# Patient Record
Sex: Female | Born: 1995 | Race: White | Hispanic: No | Marital: Married | Smoking: Never smoker
Health system: Southern US, Community
[De-identification: ages and names within clinical notes are randomized; demographics above are authoritative.]

## PROBLEM LIST (undated history)

## (undated) DIAGNOSIS — I10 Essential (primary) hypertension: Secondary | ICD-10-CM

## (undated) DIAGNOSIS — F419 Anxiety disorder, unspecified: Secondary | ICD-10-CM

## (undated) HISTORY — DX: Anxiety disorder, unspecified: F41.9

---

## 2016-12-09 ENCOUNTER — Ambulatory Visit (INDEPENDENT_AMBULATORY_CARE_PROVIDER_SITE_OTHER): Admitting: Obstetrics and Gynecology

## 2016-12-09 ENCOUNTER — Encounter: Payer: Self-pay | Admitting: Obstetrics and Gynecology

## 2016-12-09 VITALS — BP 104/60 | HR 78 | Ht 64.0 in | Wt 183.0 lb

## 2016-12-09 DIAGNOSIS — F419 Anxiety disorder, unspecified: Secondary | ICD-10-CM | POA: Insufficient documentation

## 2016-12-09 DIAGNOSIS — N898 Other specified noninflammatory disorders of vagina: Secondary | ICD-10-CM | POA: Diagnosis not present

## 2016-12-09 NOTE — Progress Notes (Signed)
   Chief Complaint  Patient presents with  . Vaginitis    Hard lump on outside of vagina x few days, painful    HPI:      Ms. Desiree Combs is a 21 y.o. No obstetric history on file. who LMP was Patient's last menstrual period was 11/19/2016 (exact date)., presents today for vaginal bump for the past 4 days. Area is painful, but pt denies any drainage, fevers. She thinks it has gotten a little bigger. No new soaps. Did shave before sx. No other vag sx.    Patient Active Problem List   Diagnosis Date Noted  . Anxiety     History reviewed. No pertinent family history.  Social History   Social History  . Marital status: Single    Spouse name: N/A  . Number of children: N/A  . Years of education: N/A   Occupational History  . Not on file.   Social History Main Topics  . Smoking status: Never Smoker  . Smokeless tobacco: Never Used  . Alcohol use No  . Drug use: No  . Sexual activity: Not Currently    Birth control/ protection: None   Other Topics Concern  . Not on file   Social History Narrative  . No narrative on file     Current Outpatient Prescriptions:  .  PARoxetine (PAXIL) 10 MG tablet, Take by mouth., Disp: , Rfl:   Review of Systems  Constitutional: Negative for fever.  Gastrointestinal: Negative for blood in stool, constipation, diarrhea, nausea and vomiting.  Genitourinary: Positive for genital sores and vaginal pain. Negative for dyspareunia, dysuria, flank pain, frequency, hematuria, urgency, vaginal bleeding and vaginal discharge.  Musculoskeletal: Negative for back pain.  Skin: Negative for rash.     OBJECTIVE:   Vitals:  BP 104/60 (BP Location: Left Arm, Patient Position: Sitting, Cuff Size: Normal)   Pulse 78   Ht 5\' 4"  (1.626 m)   Wt 183 lb (83 kg)   LMP 11/19/2016 (Exact Date)   BMI 31.41 kg/m   Physical Exam  Constitutional: She is oriented to person, place, and time and well-developed, well-nourished, and in no distress. Vital  signs are normal.  Genitourinary: Vulva exhibits erythema, lesion and tenderness. Vulva exhibits no rash.  Neurological: She is oriented to person, place, and time.     Assessment/Plan: Vaginal lesion - Folliculitis LT labia majora. Warm compresses/reassurance. F/u prn.     Return in about 1 month (around 01/08/2017) for annual past due.  Cesareo Vickrey B. Marishka Rentfrow, PA-C 12/09/2016 11:09 AM

## 2016-12-14 ENCOUNTER — Ambulatory Visit: Payer: Self-pay | Admitting: Obstetrics and Gynecology

## 2017-01-07 ENCOUNTER — Encounter: Payer: Self-pay | Admitting: Advanced Practice Midwife

## 2017-01-07 ENCOUNTER — Ambulatory Visit (INDEPENDENT_AMBULATORY_CARE_PROVIDER_SITE_OTHER): Admitting: Advanced Practice Midwife

## 2017-01-07 VITALS — BP 118/68 | Ht 64.0 in | Wt 182.0 lb

## 2017-01-07 DIAGNOSIS — Z01419 Encounter for gynecological examination (general) (routine) without abnormal findings: Secondary | ICD-10-CM

## 2017-01-07 DIAGNOSIS — Z124 Encounter for screening for malignant neoplasm of cervix: Secondary | ICD-10-CM | POA: Diagnosis not present

## 2017-01-07 NOTE — Progress Notes (Signed)
Patient ID: Desiree Combs, female   DOB: 05/03/1996, 21 y.o.   MRN: [id number]     Gynecology Annual Exam  PCP: Ezequiel Kayser, MD  Chief Complaint:  Chief Complaint  Patient presents with  . Annual Exam    History of Present Illness: Patient is a 21 y.o. G0P0000 presents for annual exam. The patient has no complaints today. She has fertility questions based on family history of fertility issues. Information and reassurance given. The patient is not currently sexually active and has not yet attempted to conceive. Her husband is currently deployed.   LMP: Patient's last menstrual period was 12/19/2016. Menarche:not applicable Average Interval: regular, 28 days Duration of flow: 7 days Heavy Menses: no Clots: no Intermenstrual Bleeding: no Postcoital Bleeding: no Dysmenorrhea: no  The patient is not currently sexually active. She currently uses none for contraception. She denies dyspareunia.  The patient does not perform self breast exams.  There is no notable family history of breast or ovarian cancer in her family.  The patient wears seatbelts: yes.  The patient has regular exercise: yes.    The patient denies current symptoms of depression.    Review of Systems: Review of Systems  Constitutional: Negative.   HENT: Negative.   Eyes: Negative.   Respiratory: Negative.   Cardiovascular: Negative.   Gastrointestinal: Negative.   Genitourinary: Negative.   Musculoskeletal: Negative.   Skin: Negative.   Neurological: Negative.   Endo/Heme/Allergies: Negative.   Psychiatric/Behavioral: Negative.     Past Medical History:  Past Medical History:  Diagnosis Date  . Anxiety     Past Surgical History:  History reviewed. No pertinent surgical history.  Gynecologic History:  Patient's last menstrual period was 12/19/2016. Contraception: none Last Pap: Results were: normal   Obstetric History: G0P0000  Family History:  History reviewed. No pertinent family  history.  Social History:  Social History   Social History  . Marital status: Single    Spouse name: N/A  . Number of children: N/A  . Years of education: N/A   Occupational History  . Not on file.   Social History Main Topics  . Smoking status: Never Smoker  . Smokeless tobacco: Never Used  . Alcohol use No  . Drug use: No  . Sexual activity: Not Currently    Birth control/ protection: None   Other Topics Concern  . Not on file   Social History Narrative  . No narrative on file    Allergies:  No Known Allergies  Medications: Prior to Admission medications   Medication Sig Start Date End Date Taking? Authorizing Provider  PARoxetine (PAXIL) 10 MG tablet Take by mouth. 05/20/16   [provider]    Physical Exam Vitals: Blood pressure 118/68, height 5\' 4"  (1.626 m), weight 182 lb (82.6 kg), last menstrual period 12/19/2016.  General: NAD HEENT: normocephalic, anicteric Thyroid: no enlargement, no palpable nodules Pulmonary: No increased work of breathing, CTAB Cardiovascular: RRR, distal pulses 2+ Breast: Breast symmetrical, no tenderness, no palpable nodules or masses, no skin or nipple retraction present, no nipple discharge.  No axillary or supraclavicular lymphadenopathy. Abdomen: NABS, soft, non-tender, non-distended.  Umbilicus without lesions.  No hepatomegaly, splenomegaly or masses palpable. No evidence of hernia  Genitourinary:  External: Normal external female genitalia.  Normal urethral meatus, normal  Bartholin's and Skene's glands.    Vagina: Normal vaginal mucosa, no evidence of prolapse.    Cervix: Grossly normal in appearance, no bleeding, no CMT  Uterus: Non-enlarged, mobile, normal  contour.    Adnexa: ovaries non-enlarged, no adnexal masses  Rectal: deferred  Lymphatic: no evidence of inguinal lymphadenopathy Extremities: no edema, erythema, or tenderness Neurologic: Grossly intact Psychiatric: mood appropriate, affect  full   Assessment: 21 y.o. G0P0000 Well Woman exam with PAP smear  Plan: Problem List Items Addressed This Visit    None    Visit Diagnoses    Well woman exam with routine gynecological exam    -  Primary   Relevant Orders   IGP, rfx Aptima HPV ASCU   Cervical cancer screening       Relevant Orders   IGP, rfx Aptima HPV ASCU      1) 4) Gardasil Series discussed and if applicable offered to patient - Patient has previously completed 3 shot series   2) STI screening was offered and declined   3) ASCCP guidelines and rational discussed.  Patient opts for yearly screening interval  4) Contraception - Patient prefers to take nothing for birth control at this time  5) Continue healthy lifestyle diet and exercise  6) Follow up 1 year for routine annual exam   Rod Can, CNM

## 2017-01-09 LAB — IGP, RFX APTIMA HPV ASCU: PAP Smear Comment: 0

## 2019-03-01 ENCOUNTER — Ambulatory Visit
Admission: EM | Admit: 2019-03-01 | Discharge: 2019-03-01 | Disposition: A | Discharge status: Home / Self Care | Attending: Family Medicine | Admitting: Family Medicine

## 2019-03-01 ENCOUNTER — Other Ambulatory Visit: Payer: Self-pay

## 2019-03-01 ENCOUNTER — Ambulatory Visit (INDEPENDENT_AMBULATORY_CARE_PROVIDER_SITE_OTHER)

## 2019-03-01 ENCOUNTER — Encounter: Payer: Self-pay | Admitting: Emergency Medicine

## 2019-03-01 DIAGNOSIS — S52502A Unspecified fracture of the lower end of left radius, initial encounter for closed fracture: Secondary | ICD-10-CM | POA: Diagnosis not present

## 2019-03-01 DIAGNOSIS — W01198A Fall on same level from slipping, tripping and stumbling with subsequent striking against other object, initial encounter: Secondary | ICD-10-CM | POA: Diagnosis not present

## 2019-03-01 DIAGNOSIS — M25532 Pain in left wrist: Secondary | ICD-10-CM | POA: Diagnosis not present

## 2019-03-01 IMAGING — CR DG WRIST COMPLETE 3+V*L*
4 series · 4 of 4 positions shown · non-contrast
Comparison: None.

CLINICAL DATA: Fall with pain to the left wrist

EXAM:
LEFT WRIST - COMPLETE 3+ VIEW

[wrist pa]
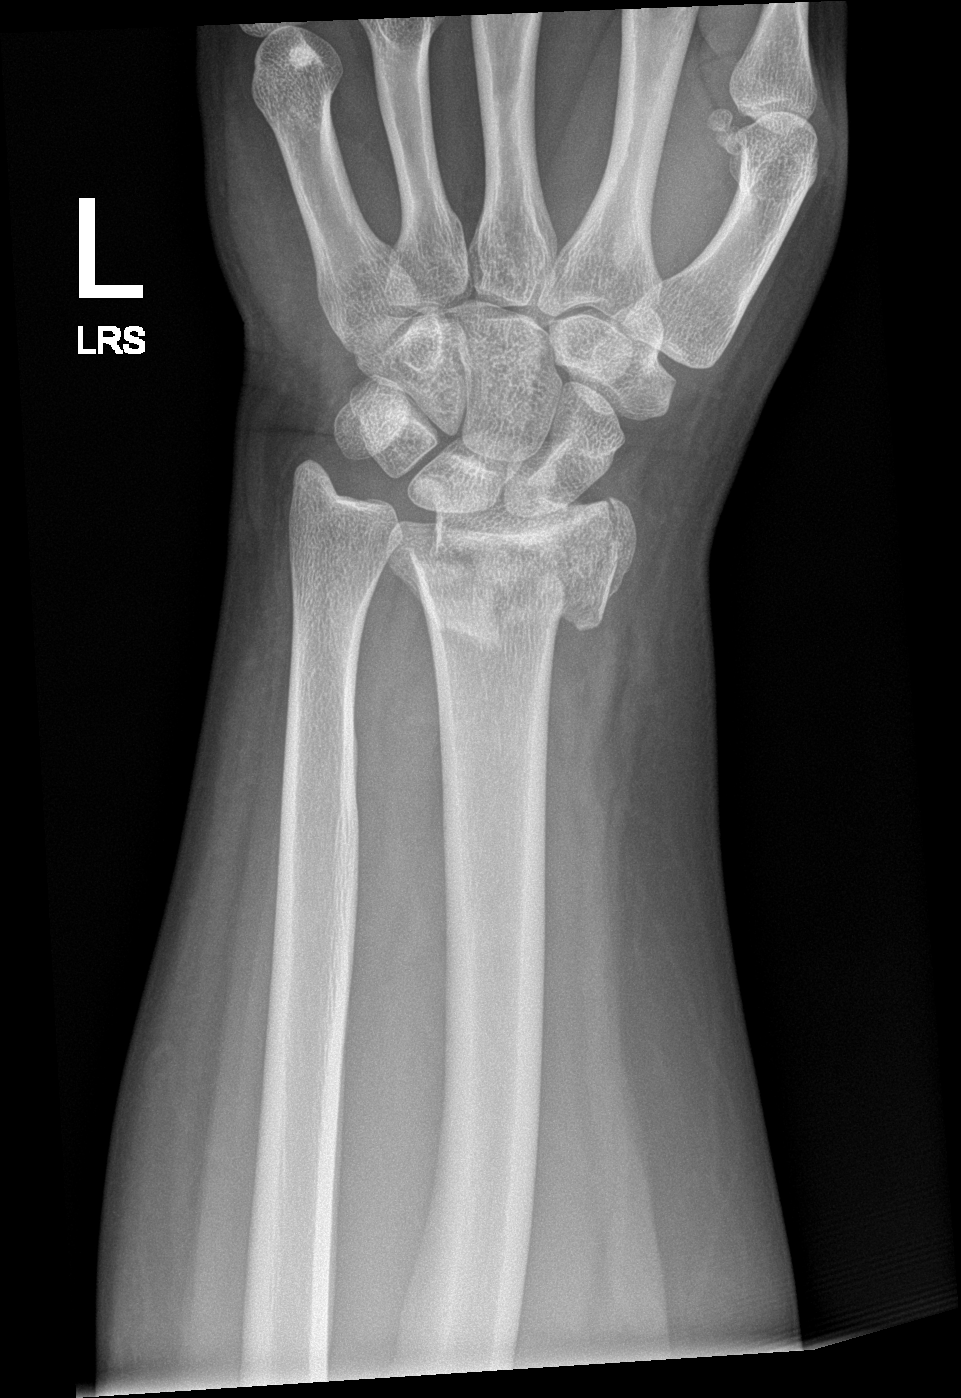

[wrist obl]
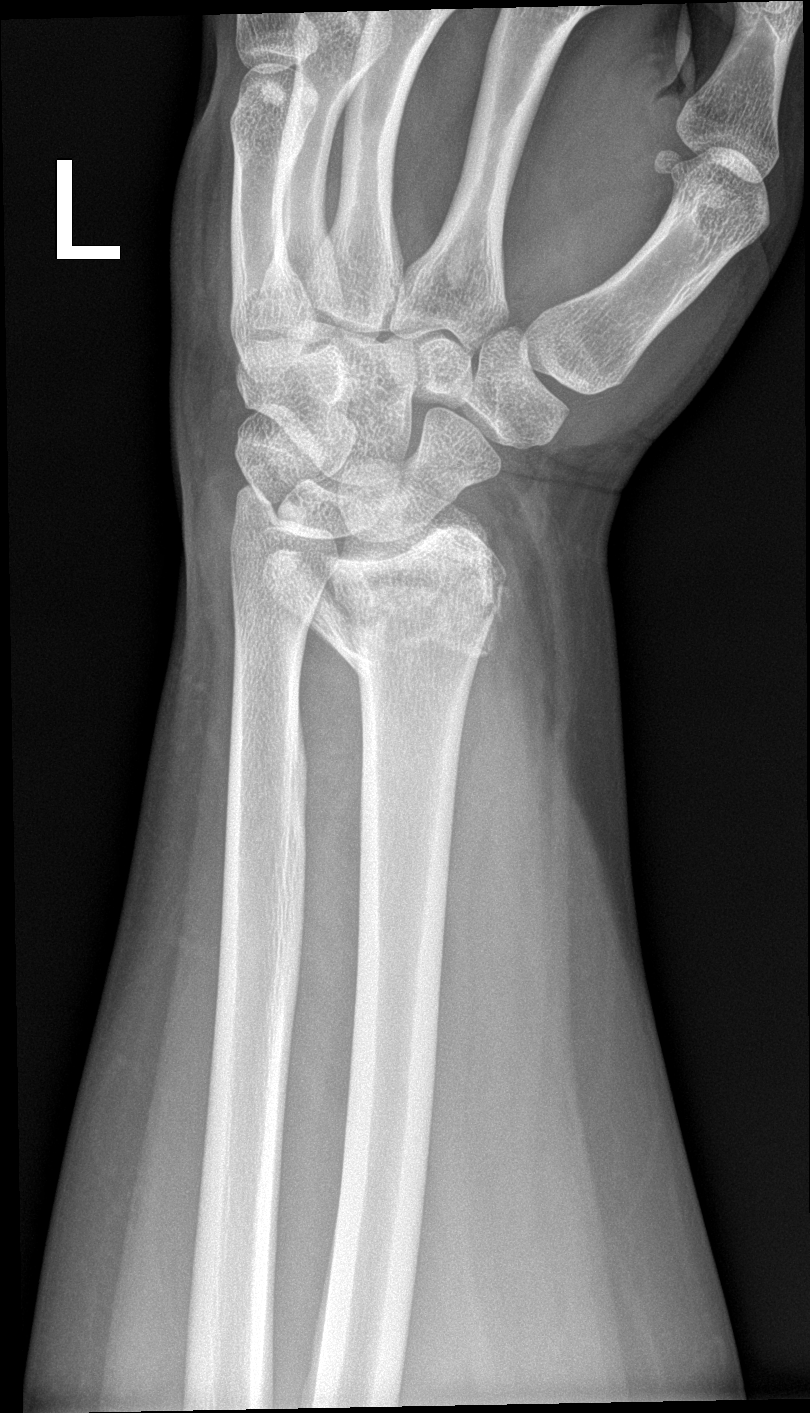

[wrist lat]
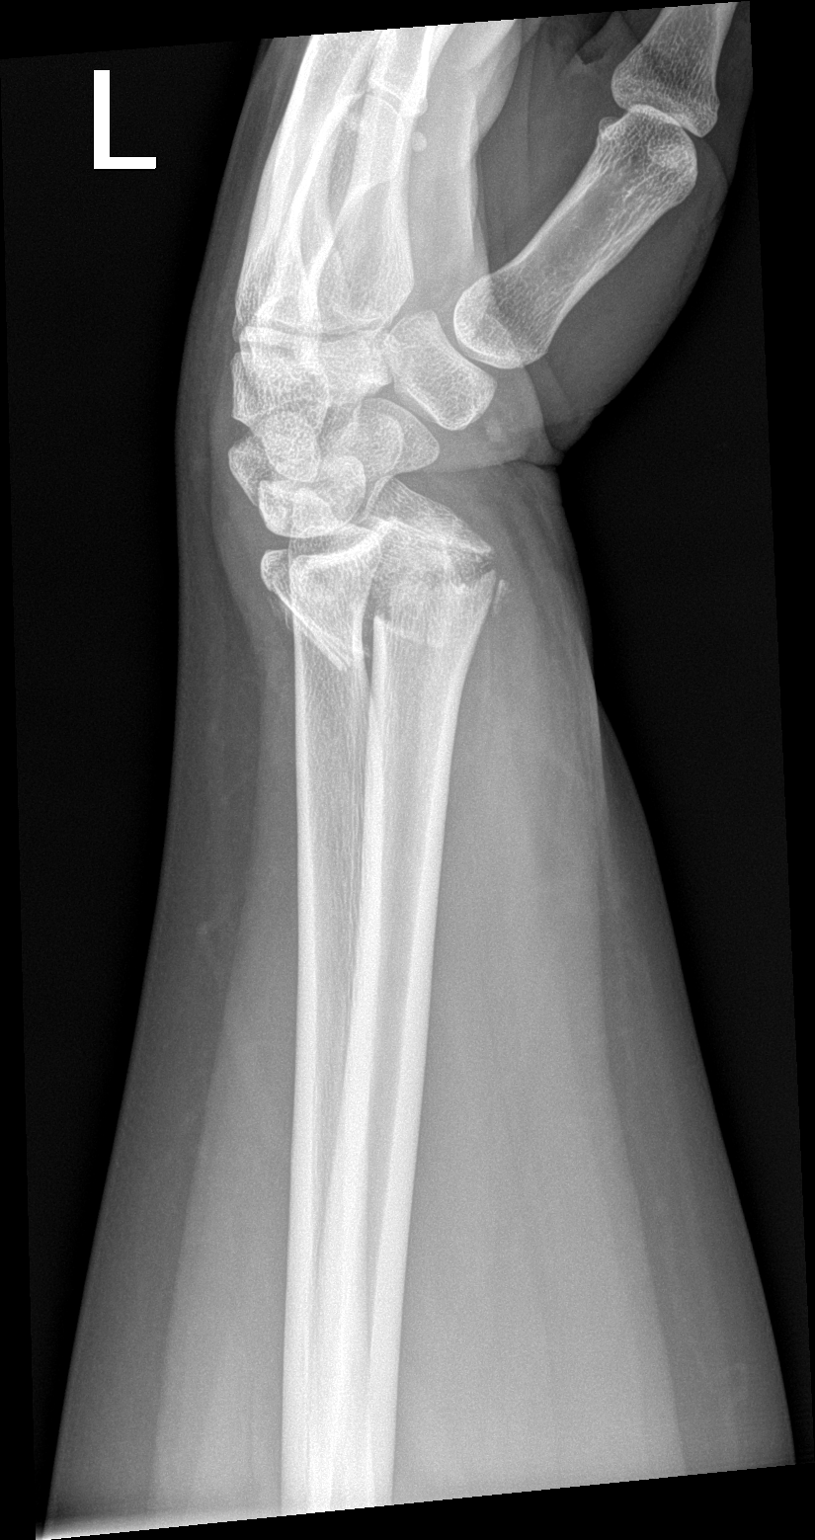

[wrist navicular]
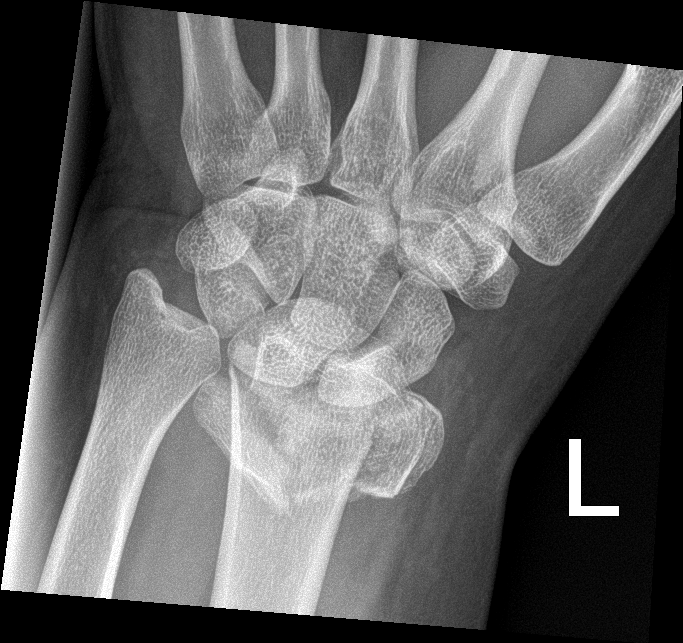

[4 of 4 positions shown; findings below may reference images not displayed]

FINDINGS: Acute markedly comminuted intra-articular distal radius fracture
with impaction and moderate dorsal angulation of distal fracture
fragment. About [DATE] bone with radial displacement of distal fracture
fragments.
IMPRESSION: Acute comminuted intra-articular distal radius fracture with
displacement and angulation

## 2019-03-01 MED ORDER — OXYCODONE-ACETAMINOPHEN 5-325 MG PO TABS: 1.0000 | ORAL_TABLET | Freq: Four times a day (QID) | ORAL | 0 refills | Status: DC | PRN

## 2019-03-01 NOTE — Discharge Instructions (Signed)
Take medication as prescribed.  Over-the-counter ibuprofen as needed.  Ice and elevate is very important.  Follow-up with orthopedist tomorrow, see above to call first thing in the morning.  Follow up with your primary care physician this week as needed. Return to Urgent care for new or worsening concerns.

## 2019-03-01 NOTE — ED Triage Notes (Signed)
Patient c/o riding on a hover board about 30 minutes and fell off injuring her left wrist.

## 2019-03-01 NOTE — ED Provider Notes (Signed)
[facility] ____________________________________________  Time seen: Approximately 5:19 PM  I have reviewed the triage vital signs and the nursing notes.   HISTORY  Chief Complaint Wrist Pain  HPI Desiree Combs is a 23 y.o. female presenting for evaluation of left wrist pain after injury that occurred just prior to arrival.  Reports within the last 30 minutes she was on a hover board, lost her balance and fell forward catching herself with her left hand.  Right-hand-dominant.  Denies any other injuries.  No head injury or loss of consciousness.  States pain and limited range of motion since.  States that she can move all of her fingers but hurts that wrist.  Denies paresthesias or pain radiation.  No recent cough, fevers, chest pain or shortness of breath.  Patient's last menstrual period was 02/22/2019.  Denies pregnancy    Past Medical History:  Diagnosis Date  . Anxiety     Patient Active Problem List   Diagnosis Date Noted  . Anxiety     History reviewed. No pertinent surgical history.   No current facility-administered medications for this encounter.   Current Outpatient Medications:  .  PARoxetine (PAXIL) 10 MG tablet, Take by mouth., Disp: , Rfl:  .  oxyCODONE-acetaminophen (PERCOCET/ROXICET) 5-325 MG tablet, Take 1 tablet by mouth every 6 (six) hours as needed for severe pain. Do not drive while taking as can cause drowsiness., Disp: 8 tablet, Rfl: 0  Allergies Patient has no known allergies.  History reviewed. No pertinent family history.  Social History Social History   Tobacco Use  . Smoking status: Never Smoker  . Smokeless tobacco: Never Used  Substance Use Topics  . Alcohol use: No  . Drug use: No    Review of Systems Constitutional: No fever Cardiovascular: Denies chest pain. Respiratory: Denies shortness of breath. Gastrointestinal: No abdominal pain.   Musculoskeletal: Positive wrist pain. Skin: Negative for rash.    ____________________________________________   PHYSICAL EXAM:  VITAL SIGNS: ED Triage Vitals  Enc Vitals Group     BP 03/01/19 1711 127/88     Pulse Rate 03/01/19 1711 85     Resp 03/01/19 1711 18     Temp 03/01/19 1711 98.7 F (37.1 C)     Temp Source 03/01/19 1711 Oral     SpO2 03/01/19 1711 100 %     Weight 03/01/19 1709 190 lb (86.2 kg)     Height 03/01/19 1709 5\' 4"  (1.626 m)     Head Circumference --      Peak Flow --      Pain Score 03/01/19 1709 8     Pain Loc --      Pain Edu? --      Excl. in [place]? --     Constitutional: Alert and oriented. Well appearing and in no acute distress. Eyes: Conjunctivae are normal.  ENT      Head: Normocephalic and atraumatic. Cardiovascular: Normal rate, regular rhythm. Grossly normal heart sounds.  Good peripheral circulation. Respiratory: Normal respiratory effort without tachypnea nor retractions. Breath sounds are clear and equal bilaterally. No wheezes, rales, rhonchi. Musculoskeletal: Bilateral distal radial pulses equal and easily palpated. Except: Left distal radius severe tenderness to palpation, mild tenderness at distal ulna, wrist deformity, localized swelling, limited range of motion, normal distal sensation, left upper extremity otherwise nontender. Neurologic:  Normal speech and language. Speech is normal. No gait instability.  Skin:  Skin is warm, dry Psychiatric: Mood and affect are normal. Speech and behavior are  normal. Patient exhibits appropriate insight and judgment   ___________________________________________   LABS (all labs ordered are listed, but only abnormal results are displayed)  Labs Reviewed - No data to display  RADIOLOGY  Dg Wrist Complete Left  Result Date: 03/01/2019 CLINICAL DATA:  Fall with pain to the left wrist EXAM: LEFT WRIST - COMPLETE 3+ VIEW COMPARISON:  None. FINDINGS: Acute markedly comminuted intra-articular distal radius fracture with impaction and moderate dorsal angulation of  distal fracture fragment. About 1/4 bone with radial displacement of distal fracture fragments. IMPRESSION: Acute comminuted intra-articular distal radius fracture with displacement and angulation Electronically Signed   By: Donavan Foil M.D.   On: 03/01/2019 17:34   ____________________________________________   PROCEDURES Procedures     INITIAL IMPRESSION / ASSESSMENT AND PLAN / ED COURSE  Pertinent labs & imaging results that were available during my care of the patient were reviewed by me and considered in my medical decision making (see chart for details).  Overall well-appearing patient.  Left wrist pain post mechanical injury.  Left wrist x-ray as above, acute comminuted intra-articular distal radius fracture with displacement and angulation.  Called and spoke with Dr. Rudene Christians orthopedic [facility] on-call who recommends splinting and will see patient tomorrow in office, possible surgery on Friday.  Patient placed in sugar tong left arm OCL splint and sling given.  Directed to ice, elevate and closely monitor follow-up tomorrow with orthopedist, information given.  Over-the-counter ibuprofen, Percocet as needed for breakthrough pain.Discussed indication, risks and benefits of medications with patient.  Discussed follow up and return parameters including no resolution or any worsening concerns. Patient verbalized understanding and agreed to plan.   [place] controlled substance database reviewed, no recent controlled substance documented.  ____________________________________________   FINAL CLINICAL IMPRESSION(S) / ED DIAGNOSES  Final diagnoses:  Closed fracture of distal end of left radius, unspecified fracture morphology, initial encounter     ED Discharge Orders         Ordered    oxyCODONE-acetaminophen (PERCOCET/ROXICET) 5-325 MG tablet  Every 6 hours PRN     03/01/19 1755           Note: This dictation was prepared with Dragon dictation along with smaller  phrase technology. Any transcriptional errors that result from this process are unintentional.         Marylene Land, NP 03/01/19 1812

## 2019-03-02 ENCOUNTER — Other Ambulatory Visit
Admission: RE | Admit: 2019-03-02 | Discharge: 2019-03-02 | Disposition: A | Discharge status: Home / Self Care | Source: Ambulatory Visit | Attending: Orthopedic Surgery | Admitting: Orthopedic Surgery

## 2019-03-02 DIAGNOSIS — Z01812 Encounter for preprocedural laboratory examination: Secondary | ICD-10-CM | POA: Diagnosis not present

## 2019-03-02 DIAGNOSIS — Z20828 Contact with and (suspected) exposure to other viral communicable diseases: Secondary | ICD-10-CM | POA: Diagnosis present

## 2019-03-02 MED ORDER — CEFAZOLIN SODIUM-DEXTROSE 2-4 GM/100ML-% IV SOLN
2.0000 g | Freq: Once | INTRAVENOUS | Status: AC
  Administered 2019-03-03: 2 g via INTRAVENOUS

## 2019-03-03 ENCOUNTER — Ambulatory Visit
Admission: RE | Admit: 2019-03-03 | Discharge: 2019-03-03 | Disposition: A | Discharge status: Home / Self Care | Attending: Orthopedic Surgery | Admitting: Orthopedic Surgery

## 2019-03-03 ENCOUNTER — Encounter: Payer: Self-pay | Admitting: *Deleted

## 2019-03-03 ENCOUNTER — Encounter
Admission: RE | Disposition: A | Discharge status: Home / Self Care | Payer: Self-pay | Source: Home / Self Care | Attending: Orthopedic Surgery

## 2019-03-03 ENCOUNTER — Ambulatory Visit

## 2019-03-03 ENCOUNTER — Ambulatory Visit: Admitting: Registered Nurse

## 2019-03-03 DIAGNOSIS — Z6833 Body mass index (BMI) 33.0-33.9, adult: Secondary | ICD-10-CM | POA: Diagnosis not present

## 2019-03-03 DIAGNOSIS — S52572A Other intraarticular fracture of lower end of left radius, initial encounter for closed fracture: Secondary | ICD-10-CM | POA: Insufficient documentation

## 2019-03-03 DIAGNOSIS — E669 Obesity, unspecified: Secondary | ICD-10-CM | POA: Diagnosis not present

## 2019-03-03 DIAGNOSIS — F419 Anxiety disorder, unspecified: Secondary | ICD-10-CM | POA: Insufficient documentation

## 2019-03-03 DIAGNOSIS — Z79899 Other long term (current) drug therapy: Secondary | ICD-10-CM | POA: Diagnosis not present

## 2019-03-03 DIAGNOSIS — Z8781 Personal history of (healed) traumatic fracture: Secondary | ICD-10-CM

## 2019-03-03 DIAGNOSIS — Z9889 Other specified postprocedural states: Secondary | ICD-10-CM

## 2019-03-03 HISTORY — PX: OPEN REDUCTION INTERNAL FIXATION (ORIF) DISTAL RADIAL FRACTURE: SHX5989

## 2019-03-03 LAB — POCT PREGNANCY, URINE: Preg Test, Ur: NEGATIVE

## 2019-03-03 LAB — SARS CORONAVIRUS 2 (TAT 6-24 HRS): SARS Coronavirus 2: NEGATIVE

## 2019-03-03 IMAGING — DX DG WRIST 2V*L*
2 series · 2 of 2 positions shown · non-contrast
Comparison: Radiographs March 01, 2019.

CLINICAL DATA: Status post open reduction and internal fixation of
left wrist fracture.

EXAM:
LEFT WRIST - 2 VIEW

[wrist ap]
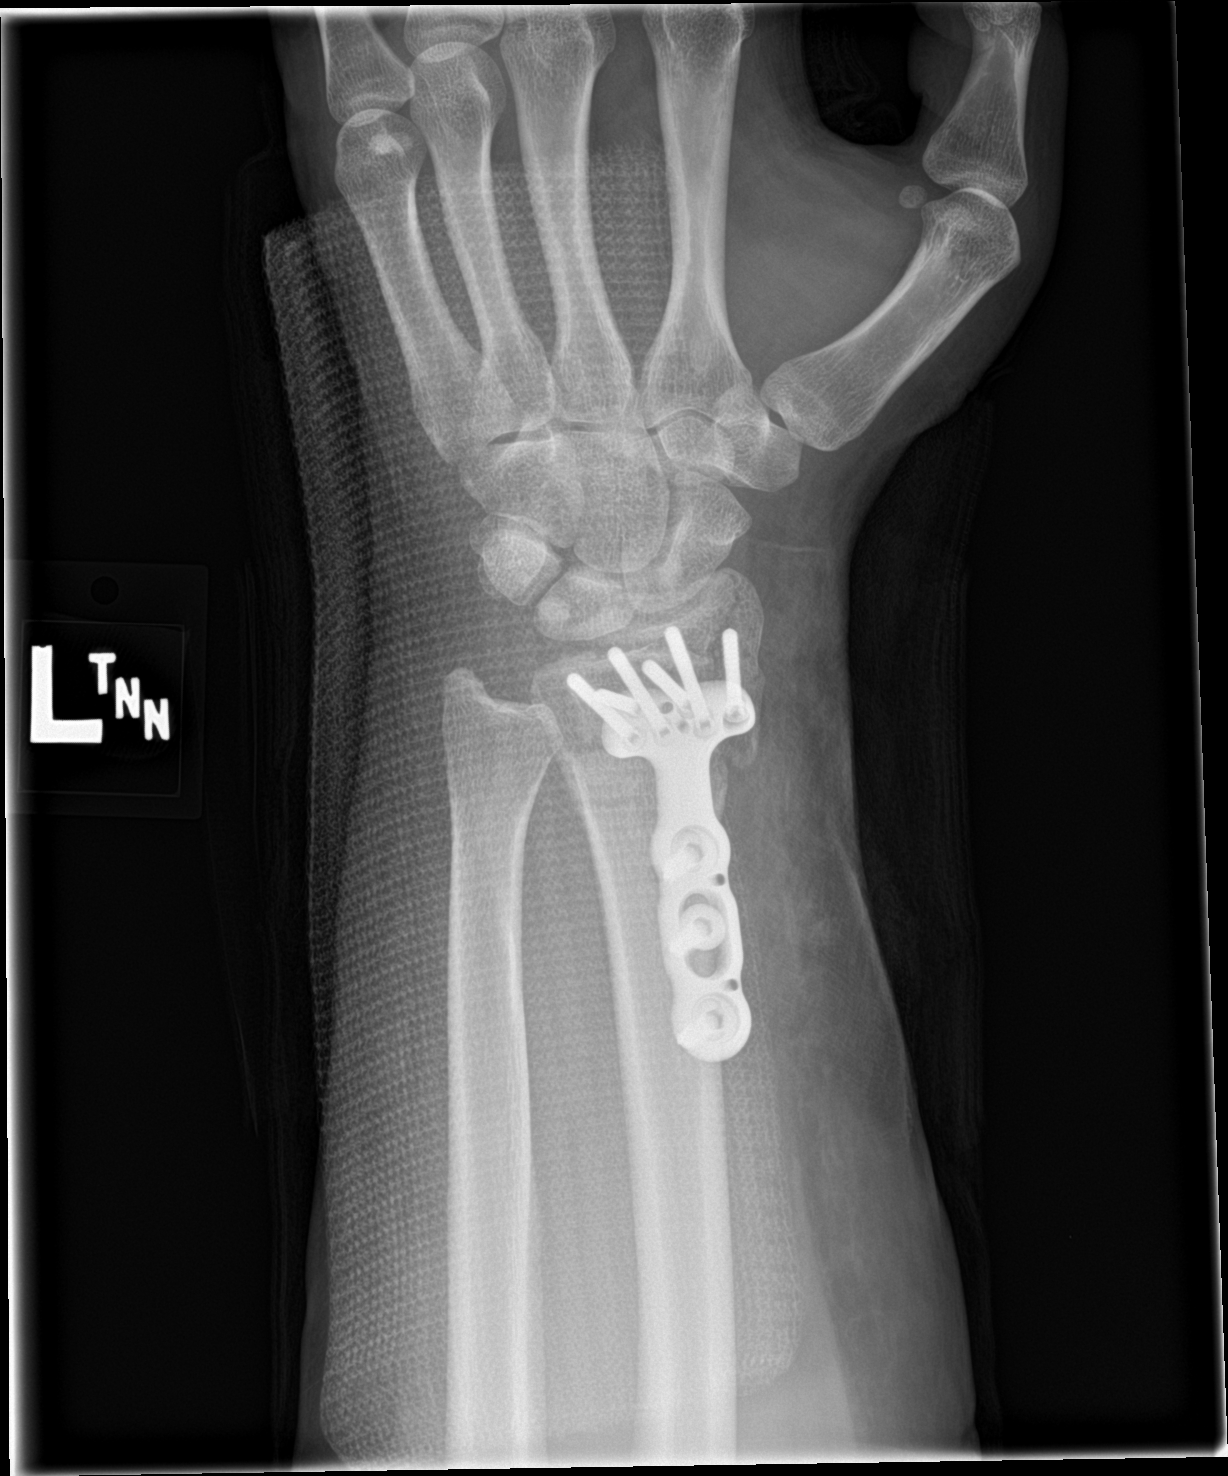

[wrist lat]
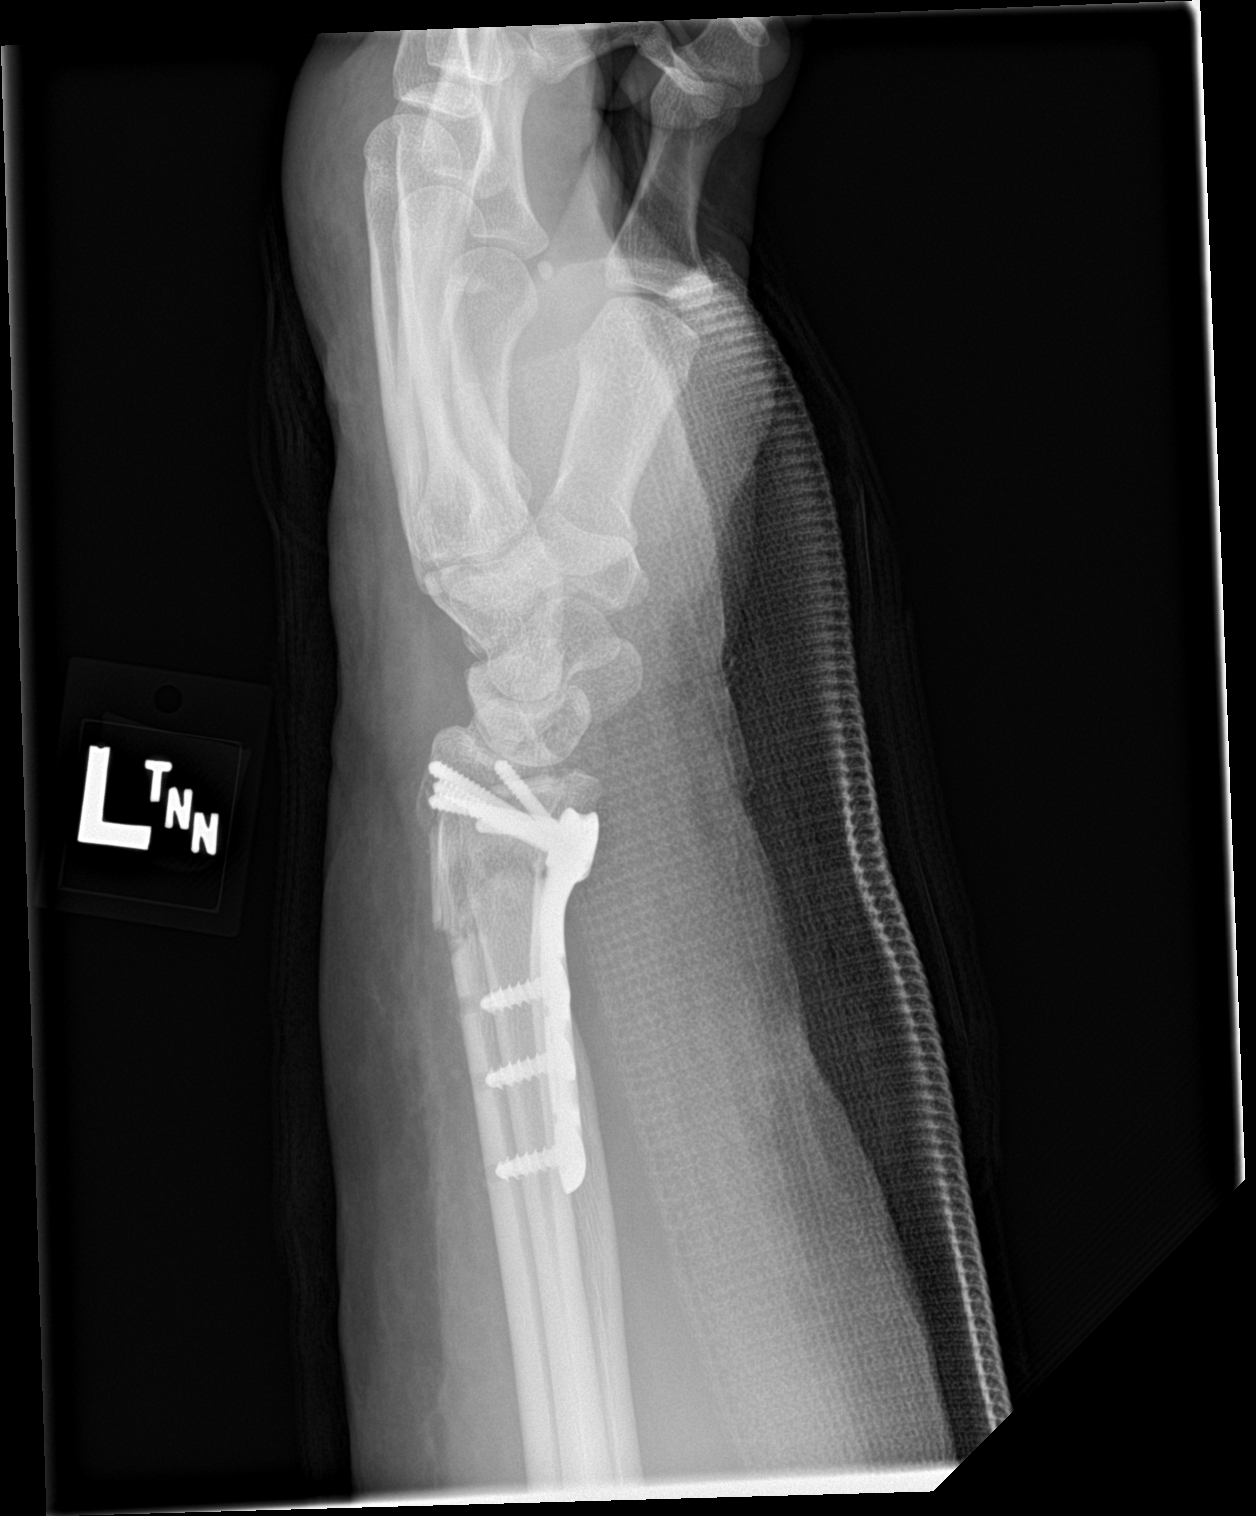

[2 of 2 positions shown; findings below may reference images not displayed]

FINDINGS: The left wrist has been splinted and immobilized. Surgical internal
fixation of comminuted distal left radial fracture is noted.
Improved alignment of fracture components is noted.
IMPRESSION: Status post surgical internal fixation of the distal left radial
fracture.

## 2019-03-03 SURGERY — OPEN REDUCTION INTERNAL FIXATION (ORIF) DISTAL RADIUS FRACTURE
Anesthesia: General | Laterality: Left

## 2019-03-03 MED ORDER — ACETAMINOPHEN 10 MG/ML IV SOLN
INTRAVENOUS | Status: AC
  Filled 2019-03-03: qty 100

## 2019-03-03 MED ORDER — ONDANSETRON HCL 4 MG PO TABS: 4.0000 mg | ORAL_TABLET | Freq: Four times a day (QID) | ORAL | Status: DC | PRN

## 2019-03-03 MED ORDER — OXYCODONE HCL 5 MG PO TABS: 5.0000 mg | ORAL_TABLET | ORAL | 0 refills | Status: DC | PRN

## 2019-03-03 MED ORDER — MORPHINE SULFATE (PF) 4 MG/ML IV SOLN: 0.5000 mg | INTRAVENOUS | Status: DC | PRN

## 2019-03-03 MED ORDER — FENTANYL CITRATE (PF) 100 MCG/2ML IJ SOLN
INTRAMUSCULAR | Status: AC
  Filled 2019-03-03: qty 2

## 2019-03-03 MED ORDER — METOCLOPRAMIDE HCL 5 MG/ML IJ SOLN: 5.0000 mg | Freq: Three times a day (TID) | INTRAMUSCULAR | Status: DC | PRN

## 2019-03-03 MED ORDER — NEOMYCIN-POLYMYXIN B GU 40-200000 IR SOLN
Status: DC | PRN
  Administered 2019-03-03: 2 mL

## 2019-03-03 MED ORDER — METOCLOPRAMIDE HCL 10 MG PO TABS: 5.0000 mg | ORAL_TABLET | Freq: Three times a day (TID) | ORAL | Status: DC | PRN

## 2019-03-03 MED ORDER — SODIUM CHLORIDE 0.9 % IV SOLN: INTRAVENOUS | Status: DC

## 2019-03-03 MED ORDER — FENTANYL CITRATE (PF) 100 MCG/2ML IJ SOLN
25.0000 ug | INTRAMUSCULAR | Status: DC | PRN
  Administered 2019-03-03 (×4): 25 ug via INTRAVENOUS

## 2019-03-03 MED ORDER — ACETAMINOPHEN 325 MG PO TABS: 325.0000 mg | ORAL_TABLET | Freq: Four times a day (QID) | ORAL | Status: DC | PRN

## 2019-03-03 MED ORDER — OXYCODONE HCL 5 MG/5ML PO SOLN: 5.0000 mg | Freq: Once | ORAL | Status: AC | PRN

## 2019-03-03 MED ORDER — FENTANYL CITRATE (PF) 100 MCG/2ML IJ SOLN
INTRAMUSCULAR | Status: DC | PRN
  Administered 2019-03-03 (×2): 25 ug via INTRAVENOUS
  Administered 2019-03-03: 50 ug via INTRAVENOUS

## 2019-03-03 MED ORDER — OXYCODONE HCL 5 MG PO TABS
ORAL_TABLET | ORAL | Status: AC
  Administered 2019-03-03: 16:00:00 5 mg via ORAL
  Filled 2019-03-03: qty 1

## 2019-03-03 MED ORDER — EPHEDRINE SULFATE 50 MG/ML IJ SOLN
INTRAMUSCULAR | Status: AC
  Filled 2019-03-03: qty 1

## 2019-03-03 MED ORDER — EPHEDRINE SULFATE 50 MG/ML IJ SOLN
INTRAMUSCULAR | Status: DC | PRN
  Administered 2019-03-03: 5 mg via INTRAVENOUS

## 2019-03-03 MED ORDER — PHENYLEPHRINE HCL (PRESSORS) 10 MG/ML IV SOLN
INTRAVENOUS | Status: DC | PRN
  Administered 2019-03-03: 200 ug via INTRAVENOUS
  Administered 2019-03-03: 50 ug via INTRAVENOUS
  Administered 2019-03-03: 100 ug via INTRAVENOUS
  Administered 2019-03-03: 50 ug via INTRAVENOUS

## 2019-03-03 MED ORDER — HYDROCODONE-ACETAMINOPHEN 5-325 MG PO TABS: 1.0000 | ORAL_TABLET | ORAL | Status: DC | PRN

## 2019-03-03 MED ORDER — NEOMYCIN-POLYMYXIN B GU 40-200000 IR SOLN
Status: AC
  Filled 2019-03-03: qty 1

## 2019-03-03 MED ORDER — LIDOCAINE HCL (CARDIAC) PF 100 MG/5ML IV SOSY
PREFILLED_SYRINGE | INTRAVENOUS | Status: DC | PRN
  Administered 2019-03-03: 100 mg via INTRAVENOUS

## 2019-03-03 MED ORDER — PROPOFOL 10 MG/ML IV BOLUS
INTRAVENOUS | Status: AC
  Filled 2019-03-03: qty 20

## 2019-03-03 MED ORDER — FENTANYL CITRATE (PF) 100 MCG/2ML IJ SOLN
INTRAMUSCULAR | Status: AC
  Administered 2019-03-03: 16:00:00 25 ug via INTRAVENOUS
  Filled 2019-03-03: qty 2

## 2019-03-03 MED ORDER — PROPOFOL 10 MG/ML IV BOLUS
INTRAVENOUS | Status: DC | PRN
  Administered 2019-03-03: 200 mg via INTRAVENOUS

## 2019-03-03 MED ORDER — ONDANSETRON HCL 4 MG/2ML IJ SOLN
INTRAMUSCULAR | Status: AC
  Filled 2019-03-03: qty 2

## 2019-03-03 MED ORDER — ONDANSETRON HCL 4 MG/2ML IJ SOLN
INTRAMUSCULAR | Status: DC | PRN
  Administered 2019-03-03: 4 mg via INTRAVENOUS

## 2019-03-03 MED ORDER — HYDROCODONE-ACETAMINOPHEN 7.5-325 MG PO TABS: 1.0000 | ORAL_TABLET | ORAL | Status: DC | PRN

## 2019-03-03 MED ORDER — MIDAZOLAM HCL 2 MG/2ML IJ SOLN
INTRAMUSCULAR | Status: DC | PRN
  Administered 2019-03-03: 2 mg via INTRAVENOUS

## 2019-03-03 MED ORDER — MIDAZOLAM HCL 2 MG/2ML IJ SOLN
INTRAMUSCULAR | Status: AC
  Filled 2019-03-03: qty 2

## 2019-03-03 MED ORDER — LIDOCAINE HCL (PF) 2 % IJ SOLN
INTRAMUSCULAR | Status: AC
  Filled 2019-03-03: qty 10

## 2019-03-03 MED ORDER — LACTATED RINGERS IV SOLN
INTRAVENOUS | Status: DC
  Administered 2019-03-03: 13:00:00 via INTRAVENOUS

## 2019-03-03 MED ORDER — SCOPOLAMINE 1 MG/3DAYS TD PT72
MEDICATED_PATCH | TRANSDERMAL | Status: AC
  Filled 2019-03-03: qty 1

## 2019-03-03 MED ORDER — CEFAZOLIN SODIUM-DEXTROSE 2-4 GM/100ML-% IV SOLN
INTRAVENOUS | Status: AC
  Filled 2019-03-03: qty 100

## 2019-03-03 MED ORDER — ONDANSETRON HCL 4 MG/2ML IJ SOLN: 4.0000 mg | Freq: Four times a day (QID) | INTRAMUSCULAR | Status: DC | PRN

## 2019-03-03 MED ORDER — OXYCODONE HCL 5 MG PO TABS
5.0000 mg | ORAL_TABLET | Freq: Once | ORAL | Status: AC | PRN
  Administered 2019-03-03: 16:00:00 5 mg via ORAL

## 2019-03-03 MED ORDER — FAMOTIDINE 20 MG PO TABS: 20.0000 mg | ORAL_TABLET | Freq: Once | ORAL | Status: DC

## 2019-03-03 MED ORDER — FAMOTIDINE 20 MG PO TABS
ORAL_TABLET | ORAL | Status: AC
  Administered 2019-03-03: 20 mg
  Filled 2019-03-03: qty 1

## 2019-03-03 MED ORDER — DEXAMETHASONE SODIUM PHOSPHATE 10 MG/ML IJ SOLN
INTRAMUSCULAR | Status: DC | PRN
  Administered 2019-03-03: 10 mg via INTRAVENOUS

## 2019-03-03 MED ORDER — DEXAMETHASONE SODIUM PHOSPHATE 10 MG/ML IJ SOLN
INTRAMUSCULAR | Status: AC
  Filled 2019-03-03: qty 1

## 2019-03-03 MED ORDER — ACETAMINOPHEN 10 MG/ML IV SOLN
INTRAVENOUS | Status: DC | PRN
  Administered 2019-03-03: 1000 mg via INTRAVENOUS

## 2019-03-03 MED ORDER — ACETAMINOPHEN 500 MG PO TABS: 500.0000 mg | ORAL_TABLET | Freq: Four times a day (QID) | ORAL | Status: DC

## 2019-03-03 MED ORDER — SEVOFLURANE IN SOLN
RESPIRATORY_TRACT | Status: AC
  Filled 2019-03-03: qty 250

## 2019-03-03 SURGICAL SUPPLY — 42 items
BIT DRILL 2 FAST STEP (BIT) ×2 IMPLANT
BIT DRILL 2.5X4 QC (BIT) ×2 IMPLANT
BNDG ELASTIC 4X5.8 VLCR STR LF (GAUZE/BANDAGES/DRESSINGS) ×3 IMPLANT
CANISTER SUCT 1200ML W/VALVE (MISCELLANEOUS) ×3 IMPLANT
CHLORAPREP W/TINT 26 (MISCELLANEOUS) ×3 IMPLANT
COVER WAND RF STERILE (DRAPES) ×3 IMPLANT
CUFF TOURN SGL QUICK 18X4 (TOURNIQUET CUFF) IMPLANT
DRAPE FLUOR MINI C-ARM 54X84 (DRAPES) ×3 IMPLANT
ELECT REM PT RETURN 9FT ADLT (ELECTROSURGICAL) ×3
ELECTRODE REM PT RTRN 9FT ADLT (ELECTROSURGICAL) ×1 IMPLANT
GAUZE SPONGE 4X4 12PLY STRL (GAUZE/BANDAGES/DRESSINGS) ×3 IMPLANT
GAUZE XEROFORM 1X8 LF (GAUZE/BANDAGES/DRESSINGS) ×6 IMPLANT
GLOVE SURG SYN 9.0  PF PI (GLOVE) ×2
GLOVE SURG SYN 9.0 PF PI (GLOVE) ×1 IMPLANT
GOWN SRG 2XL LVL 4 RGLN SLV (GOWNS) ×1 IMPLANT
GOWN STRL NON-REIN 2XL LVL4 (GOWNS) ×2
GOWN STRL REUS W/ TWL LRG LVL3 (GOWN DISPOSABLE) ×1 IMPLANT
GOWN STRL REUS W/TWL LRG LVL3 (GOWN DISPOSABLE) ×2
K-WIRE 1.6 (WIRE) ×2
K-WIRE FX5X1.6XNS BN SS (WIRE) ×1
KIT TURNOVER KIT A (KITS) ×3 IMPLANT
KWIRE FX5X1.6XNS BN SS (WIRE) IMPLANT
NDL FILTER BLUNT 18X1 1/2 (NEEDLE) ×1 IMPLANT
NEEDLE FILTER BLUNT 18X 1/2SAF (NEEDLE) ×2
NEEDLE FILTER BLUNT 18X1 1/2 (NEEDLE) ×1 IMPLANT
NS IRRIG 500ML POUR BTL (IV SOLUTION) ×3 IMPLANT
PACK EXTREMITY ARMC (MISCELLANEOUS) ×3 IMPLANT
PAD CAST CTTN 4X4 STRL (SOFTGOODS) ×2 IMPLANT
PADDING CAST COTTON 4X4 STRL (SOFTGOODS) ×4
PEG THREADED 2.5MMX20MM LONG (Peg) ×4 IMPLANT
PEG THREADED 2.5MMX22MM LONG (Peg) ×2 IMPLANT
PEG THREADED 2.5MMX24MM LONG (Peg) ×4 IMPLANT
PLATE SHORT 21.6X48.9 NRRW LT (Plate) ×2 IMPLANT
SCALPEL PROTECTED #15 DISP (BLADE) ×6 IMPLANT
SCREW CORT 3.5X10 LNG (Screw) ×6 IMPLANT
SCREW PEG 2.5X18 NONLOCK (Screw) ×2 IMPLANT
SPLINT CAST 1 STEP 3X12 (MISCELLANEOUS) ×3 IMPLANT
SUT ETHILON 4-0 (SUTURE) ×2
SUT ETHILON 4-0 FS2 18XMFL BLK (SUTURE) ×1
SUT VICRYL 3-0 27IN (SUTURE) ×3 IMPLANT
SUTURE ETHLN 4-0 FS2 18XMF BLK (SUTURE) ×1 IMPLANT
SYR 3ML LL SCALE MARK (SYRINGE) ×3 IMPLANT

## 2019-03-03 NOTE — Op Note (Signed)
03/03/2019  3:27 PM  PATIENT:  Desiree Combs  23 y.o. female  PRE-OPERATIVE DIAGNOSIS:  DISPLACED FRACTURE LEFT DISTAL RADIUS, 4 distal intra-articular fragments  POST-OPERATIVE DIAGNOSIS:  DISPLACED FRACTURE LEFT DISTAL RADIUS same  PROCEDURE:  Procedure(s): OPEN REDUCTION INTERNAL FIXATION (ORIF) DISTAL RADIAL FRACTURE (Left)  SURGEON: Laurene Footman, MD  ASSISTANTS: None  ANESTHESIA:   general  EBL:  Total I/O In: 600 [I.V.:600] Out: 5 [Blood:5]  BLOOD ADMINISTERED:none  DRAINS: none   LOCAL MEDICATIONS USED:  NONE  SPECIMEN:  No Specimen  DISPOSITION OF SPECIMEN:  None  COUNTS:  YES  TOURNIQUET:   Total Tourniquet Time Documented: Upper Arm (Left) - 40 minutes Total: Upper Arm (Left) - 40 minutes   IMPLANTS: [employer] DVR short narrow plate with multiple threaded and smooth pegs and screws  DICTATION: .Dragon Dictation  Patient was brought to the operating room and after adequate general anesthesia was obtained the left arm was prepped and draped in the usual sterile fashion.  After patient identification and timeout procedures were completed fingertrap traction was applied through the index and middle fingers with 10 pounds of traction applied.  Next the tourniquet was raised and a volar approach was made centered over the FCR tendon sheath.  The tendon sheath was incised the tendon retracted radially to protect the radial artery and associated veins.  The deep fascia was incised and retractor placed to exposethe pronator and distal fragment.  With traction applied most of the length was restored and fractures reduced fairly well.  The volar radial fragment was made placed a little more distal and a little more ulnar to an essentially anatomic position except for the loss of volar tilt and the radial styloid fragment came with this.  The short narrow DVR plate was then applied to the distal fragments and a distal first technique utilized first holding the plate  with a K wire make sure it was in the appropriate position then filling the distal screw holes with threaded pegs 1 of these pegs were subsequently changed out for a smooth peg after all the fixation of been obtained since the screw head was a little prominent.  The radial styloid screw was threaded peg was placed after bringing the plate to the shaft to get better reduction of the radial styloid fragment.  Drilling measuring and placing the threaded pegs was carried out and then 310 mm screws used to bring the plate to the shaft and near anatomical alignment was obtained with restoration of the articular surface.  Volar tilt and radial inclination was restored and checking with the mini C arm during the procedure there was no pin penetration into the joint.  Traction was removed and through range of motion the fracture appeared stable.  This point the tourniquet was let down and the wound thoroughly irrigated the wound was then closed with 3-0 Vicryl subcutaneously and 4-0 nylon for the skin.  Xeroform 4 x 4 web roll volar splint and Ace wrap then applied.  PLAN OF CARE: Discharge to home after PACU  PATIENT DISPOSITION:  PACU - hemodynamically stable.

## 2019-03-03 NOTE — Anesthesia Procedure Notes (Signed)
Procedure Name: LMA Insertion Date/Time: 03/03/2019 1:56 PM Performed by: Hedda Slade, CRNA Pre-anesthesia Checklist: Patient identified, Patient being monitored, Timeout performed, Emergency Drugs available and Suction available Patient Re-evaluated:Patient Re-evaluated prior to induction Oxygen Delivery Method: Circle system utilized Preoxygenation: Pre-oxygenation with 100% oxygen Induction Type: IV induction Ventilation: Mask ventilation without difficulty LMA: LMA inserted LMA Size: 3.5 Tube type: Oral Number of attempts: 1 Placement Confirmation: positive ETCO2 and breath sounds checked- equal and bilateral Tube secured with: Tape Dental Injury: Teeth and Oropharynx as per pre-operative assessment

## 2019-03-03 NOTE — H&P (Signed)
Reviewed paper H+P, will be scanned into chart. No changes noted.  

## 2019-03-03 NOTE — Anesthesia Post-op Follow-up Note (Signed)
Anesthesia QCDR form completed.        

## 2019-03-03 NOTE — Anesthesia Postprocedure Evaluation (Signed)
Anesthesia Post Note  Patient: Desiree Combs  Procedure(s) Performed: OPEN REDUCTION INTERNAL FIXATION (ORIF) DISTAL RADIAL FRACTURE (Left )  Patient location during evaluation: PACU Anesthesia Type: General Level of consciousness: awake and alert Pain management: pain level controlled Vital Signs Assessment: post-procedure vital signs reviewed and stable Respiratory status: spontaneous breathing, nonlabored ventilation and respiratory function stable Cardiovascular status: blood pressure returned to baseline and stable Postop Assessment: no apparent nausea or vomiting Anesthetic complications: no     Last Vitals:  Vitals:   03/03/19 1551 03/03/19 1601  BP:  (!) 154/87  Pulse: 83 90  Resp: 16 16  Temp:  36.6 C  SpO2: 100% 100%    Last Pain:  Vitals:   03/03/19 1601  TempSrc:   PainSc: [place]

## 2019-03-03 NOTE — Discharge Instructions (Addendum)
Keep arm elevated is much as possible.  Ice to the back of the wrist today and tomorrow to help with swelling.  Work on finger motion is much as you can tolerate.  Pain medicine as directed.  Keep dressing clean and dry.  If fingers swell a great deal it is okay to loosen the Ace wrap but leave the splint and cotton padding in place and reapply Ace wrap. AMBULATORY SURGERY  DISCHARGE INSTRUCTIONS   1) The drugs that you were given will stay in your system until tomorrow so for the next 24 hours you should not:  A) Drive an automobile B) Make any legal decisions C) Drink any alcoholic beverage   2) You may resume regular meals tomorrow.  Today it is better to start with liquids and gradually work up to solid foods.  You may eat anything you prefer, but it is better to start with liquids, then soup and crackers, and gradually work up to solid foods.   3) Please notify your doctor immediately if you have any unusual bleeding, trouble breathing, redness and pain at the surgery site, drainage, fever, or pain not relieved by medication.    4) Additional Instructions:        Please contact your physician with any problems or Same Day Surgery at [phone], Monday through Friday 6 am to 4 pm, or [place] at [facility] number at [phone].

## 2019-03-03 NOTE — Transfer of Care (Signed)
Immediate Anesthesia Transfer of Care Note  Patient: Desiree Combs  Procedure(s) Performed: OPEN REDUCTION INTERNAL FIXATION (ORIF) DISTAL RADIAL FRACTURE (Left )  Patient Location: PACU  Anesthesia Type:General  Level of Consciousness: sedated  Airway & Oxygen Therapy: Patient Spontanous Breathing and Patient connected to face mask oxygen  Post-op Assessment: Report given to RN and Post -op Vital signs reviewed and stable  Post vital signs: Reviewed and stable  Last Vitals:  Vitals Value Taken Time  BP 139/95 03/03/19 1516  Temp 35.9 C 03/03/19 1516  Pulse 100 03/03/19 1518  Resp 21 03/03/19 1518  SpO2 99 % 03/03/19 1518  Vitals shown include unvalidated device data.  Last Pain:  Vitals:   03/03/19 1516  TempSrc:   PainSc: Asleep         Complications: No apparent anesthesia complications

## 2019-03-03 NOTE — Anesthesia Preprocedure Evaluation (Addendum)
Anesthesia Evaluation  Patient identified by MRN, date of birth, ID band Patient awake    Reviewed: Allergy & Precautions, H&P , NPO status , Patient's Chart, lab work & pertinent test results  Airway Mallampati: II  TM Distance: >3 FB Neck ROM: full    Dental  (+) Teeth Intact   Pulmonary neg pulmonary ROS,           Cardiovascular negative cardio ROS       Neuro/Psych PSYCHIATRIC DISORDERS Anxiety negative neurological ROS     GI/Hepatic negative GI ROS, Neg liver ROS,   Endo/Other  negative endocrine ROS  Renal/GU      Musculoskeletal   Abdominal   Peds  Hematology negative hematology ROS (+)   Anesthesia Other Findings Obesity  Past Medical History: No date: Anxiety  History reviewed. No pertinent surgical history.  BMI    Body Mass Index: 32.54 kg/m      Reproductive/Obstetrics negative OB ROS                           Anesthesia Physical Anesthesia Plan  ASA: II  Anesthesia Plan: General ETT   Post-op Pain Management:    Induction:   PONV Risk Score and Plan: Scopolamine patch - Pre-op, Midazolam, Ondansetron and Dexamethasone  Airway Management Planned:   Additional Equipment:   Intra-op Plan:   Post-operative Plan:   Informed Consent: I have reviewed the patients History and Physical, chart, labs and discussed the procedure including the risks, benefits and alternatives for the proposed anesthesia with the patient or authorized representative who has indicated his/her understanding and acceptance.     Dental Advisory Given  Plan Discussed with: Anesthesiologist and CRNA  Anesthesia Plan Comments:         Anesthesia Quick Evaluation

## 2019-03-05 ENCOUNTER — Encounter: Payer: Self-pay | Admitting: Orthopedic Surgery

## 2019-04-05 ENCOUNTER — Ambulatory Visit: Admitting: Occupational Therapy

## 2019-04-12 ENCOUNTER — Ambulatory Visit: Attending: Orthopedic Surgery | Admitting: Occupational Therapy

## 2019-04-12 ENCOUNTER — Other Ambulatory Visit: Payer: Self-pay

## 2019-04-12 DIAGNOSIS — L905 Scar conditions and fibrosis of skin: Secondary | ICD-10-CM | POA: Insufficient documentation

## 2019-04-12 DIAGNOSIS — M25632 Stiffness of left wrist, not elsewhere classified: Secondary | ICD-10-CM | POA: Insufficient documentation

## 2019-04-12 DIAGNOSIS — M6281 Muscle weakness (generalized): Secondary | ICD-10-CM | POA: Insufficient documentation

## 2019-04-12 NOTE — Therapy (Signed)
[facility] [address], [place], [address] Phone: [phone]   Fax:  [phone]  Occupational Therapy Evaluation  Patient Details  Name: Desiree Combs MRN: [id number] Date of Birth: December 31, 1995 Referring Provider (OT): Rudene Christians   Encounter Date: 04/12/2019  OT End of Session - 04/12/19 1519    Visit Number  1    Number of Visits  4    Date for OT Re-Evaluation  05/24/19    OT Start Time  1433    OT Stop Time  1511    OT Time Calculation (min)  38 min    Activity Tolerance  Patient tolerated treatment well    Behavior During Therapy  [facility] for tasks assessed/performed       Past Medical History:  Diagnosis Date  . Anxiety     Past Surgical History:  Procedure Laterality Date  . OPEN REDUCTION INTERNAL FIXATION (ORIF) DISTAL RADIAL FRACTURE Left 03/03/2019   Procedure: OPEN REDUCTION INTERNAL FIXATION (ORIF) DISTAL RADIAL FRACTURE;  Surgeon: Hessie Knows, MD;  Location: [facility] ORS;  Service: Orthopedics;  Laterality: Left;    There were no vitals filed for this visit.  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  No/denies        [facility] OT Assessment - 04/12/19 0001      Assessment   Medical Diagnosis  L wrist distal radius fx with ORIF    Referring Provider (OT)  Rudene Christians    Onset Date/Surgical Date  03/03/19    Hand Dominance  Right      Balance Screen   Has the patient fallen in the past 6 months  Yes    How many times?  1    Has the patient had a decrease in activity level because of a fear of falling?   No    Is the patient reluctant to leave their home because of a fear of falling?   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, crafts       AROM   Right Forearm Supination  85 Degrees    Left Forearm Supination  70 Degrees    Right Wrist Extension  66 Degrees    Right Wrist Flexion  95 Degrees    Right Wrist Radial Deviation  25 Degrees    Right Wrist Ulnar Deviation  38 Degrees    Left Wrist Extension  48 Degrees    Left Wrist Flexion  88 Degrees    Left Wrist Radial Deviation  22 Degrees    Left Wrist Ulnar  Deviation  32 Degrees      Strength   Right Hand Grip (lbs)  63    Right Hand Lateral Pinch  17 lbs    Right Hand 3 Point Pinch  11 lbs    Left Hand Grip (lbs)  35    Left Hand Lateral Pinch  13 lbs    Left Hand 3 Point Pinch  8 lbs         done heat prior to review of HEP  Ed on scar massage and cica scarpad use  Review exercises see hand out     Heat  Scar massage and cica scar pad for night time use  PROM for wrist flexion ,ext, RD, UD  10- reps  Hold 5 sec  16 oz hammer for wrist in all planes  10 reps   pain free increase 2 sets in 3 days and 3 sets to 6 days               OT Education - 04/12/19 1519    Education Details  findings of eval and HEP    Person(s) Educated  Patient    Methods  Explanation;Handout;Verbal cues;Tactile cues;Demonstration    Comprehension  Verbalized understanding;Returned demonstration;Verbal cues required;Tactile cues required       OT Short Term Goals - 04/12/19 1524      OT SHORT TERM GOAL #1   Title  Pt to be independent in HEP to increase AROM and strength in L wrist and hand to WNL    Baseline  limited in wrist AROM and strength - decrease grip  strength    Time  4    Period  Weeks    Status  New    Target Date  05/10/19        OT Long Term Goals - 04/12/19 1524      OT LONG TERM GOAL #1   Title  L grip strength increase to more than 45 lbs to pick up more than 8 lbs withoutpain    Baseline  pick up less than 4 lbs -and grip R 63, L 35 lbs    Time  6    Period  Weeks    Status  New    Target Date  05/24/19      OT LONG TERM GOAL #2   Title  Pain on PRWHE improve with more with at least 10 points    Baseline  pain at eval on PRWHE 13/50    Time  4    Period  Weeks    Status  New    Target Date  05/10/19      OT LONG TERM GOAL #3   Title  Function on PRWHE improve with more than 10 points    Baseline  function score on PRWHE at eval 11/50    Time  6    Period  Weeks    Status  New    Target Date  05/24/19            Plan - 04/12/19 1520    Clinical Impression Statement  Pt present at OT eval in 2 days 6 wks s/p  R distal radius fx with ORIF - pt show great digits AROM and wrist - limited end range in all planes and strength  limiting his functional use  - ed pt on scar massage and cica scar pad provided - and HEP for stretches and strengthening - pt to be seen possible 2-4 visits depending  on progress    OT Occupational Profile and History  Problem Focused Assessment - Including review of records relating to presenting problem    Occupational performance deficits (Please refer to evaluation for details):  ADL's;IADL's;Work;Play;Leisure;Social Participation    Body Structure / Function / Physical Skills  ADL;Flexibility;ROM;UE functional use;Scar mobility;Strength;IADL    Rehab Potential  Excellent    Clinical Decision Making  Limited treatment options, no task modification necessary    Comorbidities Affecting Occupational Performance:  None    Modification or Assistance to Complete Evaluation   No modification of tasks or assist necessary to complete eval    OT Frequency  --   1 x wk , biweekly   OT  Duration  6 weeks    OT Treatment/Interventions  Self-care/ADL training;Patient/family education;Paraffin;Fluidtherapy;Manual Therapy;Passive range of motion;Scar mobilization;Therapeutic exercise    Plan  assess progress with HEP and upgrade as needed    OT Home Exercise Plan  see pt instruction    Consulted and Agree with Plan of Care  Patient       Patient will benefit from skilled therapeutic intervention in order to improve the following deficits and impairments:   Body Structure / Function / Physical Skills: ADL, Flexibility, ROM, UE functional use, Scar mobility, Strength, IADL       Visit Diagnosis: Stiffness of left wrist, not elsewhere classified - Plan: Ot plan of care cert/re-cert  Muscle weakness (generalized) - Plan: Ot plan of care cert/re-cert  Scar condition and fibrosis of skin - Plan: Ot plan of care cert/re-cert    Problem List Patient Active Problem List   Diagnosis Date Noted  . Anxiety     Rosalyn Gess OTR/L,CLT 04/12/2019, 3:30 PM  [facility] [address], [place], [address] Phone: [phone]   Fax:  [phone]  Name: Desiree Combs MRN: [id number] Date of Birth: [birth year]

## 2019-04-12 NOTE — Patient Instructions (Signed)
Heat  Scar massage and cica scar pad for night time use  PROM for wrist flexion ,ext, RD, UD  10- reps  Hold 5 sec  16 oz hammer for wrist in all planes  10 reps   pain free increase 2 sets in 3 days and 3 sets to 6 days

## 2019-04-19 ENCOUNTER — Other Ambulatory Visit: Payer: Self-pay

## 2019-04-19 ENCOUNTER — Ambulatory Visit: Admitting: Occupational Therapy

## 2019-04-19 DIAGNOSIS — M25632 Stiffness of left wrist, not elsewhere classified: Secondary | ICD-10-CM | POA: Diagnosis not present

## 2019-04-19 DIAGNOSIS — L905 Scar conditions and fibrosis of skin: Secondary | ICD-10-CM

## 2019-04-19 DIAGNOSIS — M6281 Muscle weakness (generalized): Secondary | ICD-10-CM

## 2019-04-19 NOTE — Therapy (Signed)
[facility] [address], [place], [address] Phone: [phone]   Fax:  [phone]  Occupational Therapy Treatment  Patient Details  Name: Desiree Combs MRN: [id number] Date of Birth: 03-10-1996 Referring Provider (OT): Rudene Christians   Encounter Date: 04/19/2019  OT End of Session - 04/19/19 1709    Visit Number  2    Number of Visits  4    Date for OT Re-Evaluation  05/24/19    OT Start Time  1425    OT Stop Time  1457    OT Time Calculation (min)  32 min    Activity Tolerance  Patient tolerated treatment well    Behavior During Therapy  [facility] for tasks assessed/performed       Past Medical History:  Diagnosis Date  . Anxiety     Past Surgical History:  Procedure Laterality Date  . OPEN REDUCTION INTERNAL FIXATION (ORIF) DISTAL RADIAL FRACTURE Left 03/03/2019   Procedure: OPEN REDUCTION INTERNAL FIXATION (ORIF) DISTAL RADIAL FRACTURE;  Surgeon: Hessie Knows, MD;  Location: [facility] ORS;  Service: Orthopedics;  Laterality: Left;    There were no vitals filed for this visit.  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  No/denies         [facility] OT Assessment - 04/19/19 0001      AROM   Left Forearm Supination  80 Degrees    Left Wrist Extension  54 Degrees    Left Wrist Flexion  90 Degrees    Left Wrist Radial Deviation  25 Degrees    Left Wrist Ulnar Deviation  38 Degrees      Strength   Right Hand Grip (lbs)  63    Right Hand Lateral Pinch  17 lbs    Right Hand 3 Point Pinch  14 lbs    Left Hand Lateral Pinch  16 lbs    Left Hand 3 Point Pinch  13 lbs        measurements taken - see flowsheet        OT Treatments/Exercises (OP) - 04/19/19 0001      Moist Heat Therapy   Number Minutes Moist Heat  6 Minutes    Moist Heat Location  Wrist   prior to ROM        scar massage ed on and add kinesiotape 30% pull parallel and across 2 at 100% pull during day and cica scarpad use night time   PROM for wrist flexion ,ext, RD, UD and add this date supination   10- reps  Hold 5 sec    Add putty teal med for gripping , lat and 3 point grip  12 reps pain free - done well  And 2 lbs for wrist in all planes  Pain free   12 reps  Increase to 2 set in 3 days and then 3 sets in 6 days for weight and putty       OT Education - 04/19/19 1709    Education Details  upgrade to [employer]) Educated  Patient    Methods  Explanation;Handout;Verbal cues;Tactile cues;Demonstration    Comprehension  Verbalized understanding;Returned demonstration;Verbal cues required;Tactile cues required       OT Short Term Goals - 04/12/19 1524      OT SHORT TERM GOAL #1   Title  Pt to be independent in HEP to increase AROM and strength in L wrist and hand to WNL    Baseline  limited in wrist AROM and strength - decrease grip strength    Time  4    Period  Weeks    Status  New    Target Date  05/10/19        OT Long Term Goals - 04/12/19 1524      OT LONG TERM GOAL #1   Title  L grip strength increase to more than 45 lbs to pick up more than 8 lbs withoutpain    Baseline  pick up less than 4 lbs -and grip R 63, L 35 lbs    Time  6    Period  Weeks    Status  New    Target Date  05/24/19      OT LONG TERM GOAL #2   Title  Pain on PRWHE improve with more with at least 10 points    Baseline  pain at eval on PRWHE 13/50    Time  4    Period  Weeks     Status  New    Target Date  05/10/19      OT LONG TERM GOAL #3   Title  Function on PRWHE improve with more than 10 points    Baseline  function score on PRWHE at eval 11/50    Time  6    Period  Weeks    Status  New    Target Date  05/24/19            Plan - 04/19/19 1710    Clinical Impression Statement  Pt is about 7 wks s/p R distal radius fx with ORIF - pt show increase wrist AROM in all planes  - upgrade her HEP to 2 lb weight and putty for grip and prehension - but pain free - scar massage to cont with    OT Occupational Profile and History  Problem Focused Assessment - Including review of records relating to presenting problem    Occupational performance deficits (Please refer to evaluation for details):  ADL's;IADL's;Work;Play;Leisure;Social Participation    Body Structure / Function / Physical Skills  ADL;Flexibility;ROM;UE functional use;Scar mobility;Strength;IADL    Rehab Potential  Excellent    Clinical Decision Making  Limited treatment options, no task modification necessary    Comorbidities Affecting Occupational Performance:  None    Modification or Assistance to Complete Evaluation   No modification of tasks or assist necessary to complete eval    OT Frequency  1x / week    OT Duration  --   5wks   OT Treatment/Interventions  Self-care/ADL training;Patient/family education;Paraffin;Fluidtherapy;Manual Therapy;Passive range of motion;Scar mobilization;Therapeutic exercise    Plan  assess progress with HEP and upgrade as needed    OT Home Exercise Plan  see pt instruction    Consulted and Agree  with Plan of Care  Patient       Patient will benefit from skilled therapeutic intervention in order to improve the following deficits and impairments:   Body Structure / Function / Physical Skills: ADL, Flexibility, ROM, UE functional use, Scar mobility, Strength, IADL       Visit Diagnosis: Stiffness of left wrist, not elsewhere classified  Muscle weakness  (generalized)  Scar condition and fibrosis of skin    Problem List Patient Active Problem List   Diagnosis Date Noted  . Anxiety     Rosalyn Gess OTR/L,CLT 04/19/2019, 5:12 PM  [facility] [address] [place], [address] Phone: [phone]   Fax:  [phone]  Name: Desiree Combs MRN: [id number] Date of Birth: 16-Oct-1995

## 2019-04-19 NOTE — Patient Instructions (Signed)
Add putty teal med for gripping , lat and 3 point grip  12 reps pain free  And 2 lbs for wrist in all planes  Pain free  Add PROM for sup prior to weight  12 reps  Increase to 2 set in 3 days and then 3 sets in 6 days

## 2019-04-26 ENCOUNTER — Ambulatory Visit: Attending: Orthopedic Surgery | Admitting: Occupational Therapy

## 2019-04-26 ENCOUNTER — Other Ambulatory Visit: Payer: Self-pay

## 2019-04-26 DIAGNOSIS — M6281 Muscle weakness (generalized): Secondary | ICD-10-CM | POA: Diagnosis present

## 2019-04-26 DIAGNOSIS — M25632 Stiffness of left wrist, not elsewhere classified: Secondary | ICD-10-CM | POA: Diagnosis present

## 2019-04-26 DIAGNOSIS — L905 Scar conditions and fibrosis of skin: Secondary | ICD-10-CM | POA: Insufficient documentation

## 2019-04-26 NOTE — Patient Instructions (Signed)
Upgrade HEP to wrist extention gentle table and wall slides  2 lbs cont with at wrist  And increase putty resistance to green - grip , pulling and twisting with all digits  1 set of 10  Increase 5 and then 10 days - with another set if not pain increase  Cont teal putty for lat and 3 point grip

## 2019-04-26 NOTE — Therapy (Signed)
[facility] [address] [place], [address] Phone: [phone]   Fax:  [phone]  Occupational Therapy Treatment  Patient Details  Name: Desiree Combs MRN: [id number] Date of Birth: 07-Nov-1995 Referring Provider (OT): Rudene Christians   Encounter Date: 04/26/2019  OT End of Session - 04/26/19 1541    Visit Number  3    Number of Visits  4    Date for OT Re-Evaluation  05/24/19    OT Start Time  [id number]    OT Stop Time  1520    OT Time Calculation (min)  35 min    Activity Tolerance  Patient tolerated treatment well    Behavior During Therapy  [facility] for tasks assessed/performed       Past Medical History:  Diagnosis Date  . Anxiety     Past Surgical History:  Procedure Laterality Date  . OPEN REDUCTION INTERNAL FIXATION (ORIF) DISTAL RADIAL FRACTURE Left 03/03/2019   Procedure: OPEN REDUCTION INTERNAL FIXATION (ORIF) DISTAL RADIAL FRACTURE;  Surgeon: Hessie Knows, MD;  Location: [facility] ORS;  Service: Orthopedics;  Laterality: Left;    There were no vitals filed for this visit.  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  No/denies         [facility] OT Assessment - 04/26/19 0001      AROM   Left Forearm Supination  85 Degrees    Right Wrist Extension  66 Degrees    Right Wrist Flexion  95 Degrees    Right Wrist Radial Deviation  25 Degrees    Right Wrist Ulnar Deviation   38 Degrees    Left Wrist Extension  58 Degrees    Left Wrist Flexion  95 Degrees    Left Wrist Radial Deviation  25 Degrees    Left Wrist Ulnar Deviation  38 Degrees      Strength   Right Hand Grip (lbs)  63    Right Hand Lateral Pinch  17 lbs    Right Hand 3 Point Pinch  14 lbs    Left Hand Grip (lbs)  48    Left Hand Lateral Pinch  16 lbs    Left Hand 3 Point Pinch  13 lbs       assess progress in AROM and grip/prehension strength - see flowsheet   Scar assess - remove kinesiotape  Scar still adhere but better and thick - pt to use cica scar pad more -during day too  Provided new one Ed on scar massage - doing it against AROM for wrist flexion , extention          OT Treatments/Exercises (OP) - 04/26/19 0001      Moist Heat Therapy   Number Minutes Moist  Heat  6 Minutes    Moist Heat Location  Wrist   prior to soft tissue and ther ex       wrist extention gentle table slides done with pt - no issues  And can also do  wall slides in shower  Hold off on wrist flexion and RD, UD PROM   2 lbs cont with at wrist - 2-3 sets - with no pain   Upgrade  putty resistance to firm green - grip , pulling and twisting with all digits  1 set of 10  Increase  At 5 and then 10 days - with another set if not pain increase  Cont teal putty for lat and 3 point grip        OT Education - 04/26/19 1635    Education Details  upgrade to [employer]) Educated  Patient    Methods  Explanation;Handout;Verbal cues;Tactile cues;Demonstration       OT Short Term Goals - 04/12/19 1524      OT SHORT TERM GOAL #1   Title  Pt to be independent in HEP to increase AROM and strength in L wrist and hand to WNL    Baseline  limited in wrist AROM and strength - decrease grip strength    Time  4    Period  Weeks    Status  New    Target Date  05/10/19        OT Long Term Goals - 04/12/19 1524      OT LONG TERM GOAL #1   Title  L grip strength increase to more than 45 lbs to  pick up more than 8 lbs withoutpain    Baseline  pick up less than 4 lbs -and grip R 63, L 35 lbs    Time  6    Period  Weeks    Status  New    Target Date  05/24/19      OT LONG TERM GOAL #2   Title  Pain on PRWHE improve with more with at least 10 points    Baseline  pain at eval on PRWHE 13/50    Time  4    Period  Weeks    Status  New    Target Date  05/10/19      OT LONG TERM GOAL #3   Title  Function on PRWHE improve with more than 10 points    Baseline  function score on PRWHE at eval 11/50    Time  6    Period  Weeks    Status  New    Target Date  05/24/19            Plan - 04/26/19 1542    Clinical Impression Statement  Pt is about 8 wks s/p R distal redius fx with ORIF - pt has been doing some scar massage and taping - still thick and adhere - limiting her wrist extention - pt to focus on PROM for wrist extention , scar mobs and strengthening with 2 lbs weight and increase putty provided this date - will follow up in 2 wks    OT Occupational Profile and History  Problem Focused Assessment - Including review of records relating to presenting problem    Occupational performance deficits (Please refer to evaluation for details):  ADL's;IADL's;Work;Play;Leisure;Social Participation    Body Structure / Function / Physical Skills  ADL;Flexibility;ROM;UE functional use;Scar mobility;Strength;IADL    Rehab Potential  Excellent  Clinical Decision Making  Limited treatment options, no task modification necessary    Comorbidities Affecting Occupational Performance:  None    Modification or Assistance to Complete Evaluation   No modification of tasks or assist necessary to complete eval    OT Frequency  Biweekly    OT Duration  4 weeks    OT Treatment/Interventions  Self-care/ADL training;Patient/family education;Paraffin;Fluidtherapy;Manual Therapy;Passive range of motion;Scar mobilization;Therapeutic exercise    Plan  assess progress with HEP and upgrade as needed    OT  Home Exercise Plan  see pt instruction    Consulted and Agree with Plan of Care  Patient       Patient will benefit from skilled therapeutic intervention in order to improve the following deficits and impairments:   Body Structure / Function / Physical Skills: ADL, Flexibility, ROM, UE functional use, Scar mobility, Strength, IADL       Visit Diagnosis: Stiffness of left wrist, not elsewhere classified  Muscle weakness (generalized)  Scar condition and fibrosis of skin    Problem List Patient Active Problem List   Diagnosis Date Noted  . Anxiety     Rosalyn Gess OTR/L,CLT 04/26/2019, 5:29 PM  [facility] [address] [place], [address] Phone: [phone]   Fax:  [phone]  Name: Desiree Combs MRN: [id number] Date of Birth: 1995/08/26

## 2019-05-10 ENCOUNTER — Ambulatory Visit: Admitting: Occupational Therapy

## 2019-05-10 ENCOUNTER — Other Ambulatory Visit: Payer: Self-pay

## 2019-05-10 DIAGNOSIS — M25632 Stiffness of left wrist, not elsewhere classified: Secondary | ICD-10-CM | POA: Diagnosis not present

## 2019-05-10 DIAGNOSIS — M6281 Muscle weakness (generalized): Secondary | ICD-10-CM

## 2019-05-10 DIAGNOSIS — L905 Scar conditions and fibrosis of skin: Secondary | ICD-10-CM

## 2019-05-10 NOTE — Therapy (Signed)
[facility] [address] [place], [address] Phone: [phone]   Fax:  [phone]  Occupational Therapy Treatment  Patient Details  Name: Desiree Combs MRN: [id number] Date of Birth: 1996-04-05 Referring Provider (OT): Rudene Christians   Encounter Date: 05/10/2019  OT End of Session - 05/10/19 1416    Visit Number  4    Number of Visits  4    Date for OT Re-Evaluation  05/10/19    OT Start Time  1333    OT Stop Time  1355    OT Time Calculation (min)  22 min    Activity Tolerance  Patient tolerated treatment well    Behavior During Therapy  [facility] for tasks assessed/performed       Past Medical History:  Diagnosis Date  . Anxiety     Past Surgical History:  Procedure Laterality Date  . OPEN REDUCTION INTERNAL FIXATION (ORIF) DISTAL RADIAL FRACTURE Left 03/03/2019   Procedure: OPEN REDUCTION INTERNAL FIXATION (ORIF) DISTAL RADIAL FRACTURE;  Surgeon: Hessie Knows, MD;  Location: [facility] ORS;  Service: Orthopedics;  Laterality: Left;    There were no vitals filed for this visit.  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  No/denies         [facility] OT Assessment - 05/10/19 0001      AROM   Left Forearm Supination  90 Degrees    Right Wrist Extension  66 Degrees    Right Wrist Flexion  95 Degrees    Right Wrist Radial Deviation  25 Degrees     Right Wrist Ulnar Deviation  38 Degrees    Left Wrist Extension  64 Degrees    Left Wrist Flexion  95 Degrees    Left Wrist Radial Deviation  25 Degrees    Left Wrist Ulnar Deviation  38 Degrees      Strength   Right Hand Grip (lbs)  63    Right Hand Lateral Pinch  18 lbs    Right Hand 3 Point Pinch  16 lbs    Left Hand Grip (lbs)  50    Left Hand Lateral Pinch  17 lbs    Left Hand 3 Point Pinch  14 lbs        assess AROM in L wrist in all planes - no pain  Strength in all planes - 5/5  Grip and prehension assess  See flow sheet    Scar adhesion better but hyper trophic- provided more  cica scar pad for night time use for another 2-4 wks  Cont with scar massage for another 2-4 wks     Cont with 2 lbs for 2-4 wks - pain free - 2-3  sets - 1 x day   Cont with  firm green - grip , pulling and twisting with all digits  2 sets of 10  And lat and 3 point - but pain free   1 x day 2 - 3 sets but pain free                OT Education - 05/10/19 1416    Education Details  discharge instruction    Person(s) Educated  Patient    Methods  Explanation;Handout;Verbal cues;Tactile cues;Demonstration    Comprehension  Verbalized understanding;Returned demonstration;Verbal cues required;Tactile cues required       OT Short Term Goals - 05/10/19 1421      OT SHORT TERM GOAL #1   Title  Pt to be independent in HEP to increase AROM and strength in L wrist and hand to WNL    Status  Achieved        OT Long Term Goals - 05/10/19 1422      OT LONG TERM GOAL #1   Title  L grip strength increase to more than 45 lbs to pick up more than 8 lbs withoutpain    Baseline  no carrying more than 2-3 lbs - but grip R 63 and L 50 lbs with no pain    Status  Achieved      OT LONG TERM GOAL #2   Title  Pain on PRWHE improve with more with at least 10 points    Baseline  pain at eval on PRWHE 13/50 - no pain    Status  Achieved      OT LONG TERM GOAL #3   Title  Function  on PRWHE improve with more than 10 points    Baseline  function score on PRWHE at eval 11/50 - only no doing heavy activities - still 10/50 because of heavy lifting or pushing up    Status  Deferred            Plan - 05/10/19 1417    Clinical Impression Statement  Pt is 10 wks s/p R distal radius fx with ORIF - pt show great progress from [facility] in L wrist AROM , and grip /prehension strength - pt using hand in all activities except pushing up or heavy activities - pt has appt with surgeon next week - pt discharge at this time with HEP for another 3-4 wks    OT Occupational Profile and History  Problem Focused Assessment - Including review of records relating to presenting problem    Occupational performance deficits (Please refer to evaluation for details):  ADL's;IADL's;Work;Play;Leisure;Social Participation    Body Structure / Function / Physical Skills  ADL;Flexibility;ROM;UE functional use;Scar mobility;Strength;IADL    Rehab Potential  Excellent    Clinical Decision Making  Limited treatment options, no task modification necessary    Comorbidities Affecting Occupational Performance:  None    Modification or Assistance to Complete Evaluation   No modification of tasks or assist necessary to complete eval    OT Treatment/Interventions  Self-care/ADL training;Patient/family education;Paraffin;Fluidtherapy;Manual Therapy;Passive range of motion;Scar mobilization;Therapeutic exercise    Plan  discharge with HEP    OT Home Exercise Plan  see pt instruction    Consulted and Agree with Plan of Care  Patient       Patient will benefit from skilled therapeutic intervention in order to improve the following deficits and impairments:   Body Structure / Function / Physical Skills: ADL, Flexibility, ROM, UE functional use, Scar mobility,  Strength, IADL       Visit Diagnosis: Stiffness of left wrist, not elsewhere classified  Muscle weakness (generalized)  Scar condition and fibrosis of  skin    Problem List Patient Active Problem List   Diagnosis Date Noted  . Anxiety     Rosalyn Gess OTR/L,CLT 05/10/2019, 2:26 PM  [facility] [address] [place], [address] Phone: [phone]   Fax:  [phone]  Name: LAVINE ASANTE MRN: [id number] Date of Birth: [birth year]

## 2019-05-10 NOTE — Patient Instructions (Signed)
Pt to cont with 2 lbs weight and green putty for grip, lat , 3 point grip  And pulling and twisting - if doing all 3 and 2-3 sets   do 1 x day  Pain free  Cont with cica scar pad for night time - provided 4 wks of scar pad

## 2019-06-23 NOTE — L&D Delivery Note (Signed)
Delivery Note At 6:48 AM a viable female was delivered via Vaginal, Spontaneous (Presentation: Right Occiput Anterior).  APGAR: 9, 10; weight  .   Placenta status: Spontaneous, Intact.  Cord: 3 vessels with the following complications: None.  Cord pH: n/a  Anesthesia: Epidural Episiotomy: None Lacerations: 2nd degree;Vaginal;Perineal Suture Repair: 3.0 vicryl Est. Blood Loss (mL):  400 (QBL pending)  Mom to postpartum.  Baby to Couplet care / Skin to Skin.  Called to see patient.  Mom pushed to deliver a viable female infant.  The head followed by shoulders, which delivered without difficulty, and the rest of the body.  A tight double nuchal cord noted and reduced after delivery of baby without difficulty.  Baby to mom's chest.  Cord clamped and cut after > 1 min delay.  No cord blood obtained.  Placenta delivered spontaneously, intact, with a 3-vessel cord.  Second degree perineal laceration repaired with 3-0 Vicryl in standard fashion.  All counts correct.  Hemostasis obtained with IV pitocin and fundal massage. EBL 400 mL (QBL pending).  Prentice Docker, MD 03/09/2020, 7:14 AM

## 2019-08-01 NOTE — Progress Notes (Signed)
PCP:  Ezequiel Kayser, MD   Chief Complaint  Patient presents with  . Gynecologic Exam     HPI:      Ms. Desiree Combs is a 24 y.o. G0P0000 who LMP was Patient's last menstrual period was 06/22/2019 (exact date)., presents today for her annual examination.  Her menses are regular every 4-5 wks, lasting 7 days.  Dysmenorrhea none. She does have occas intermenstrual bleeding.  Sex activity: single partner, contraception - none. Conception ok. Had been doing withdrawal up until 11/20. Is late for menses this month. Not taking PNVs. Last Pap: 01/07/17  Results were: no abnormalities  Hx of STDs: none  There is no FH of breast cancer. There is no FH of ovarian cancer. The patient does not do self-breast exams.  Tobacco use: The patient denies current or previous tobacco use. Alcohol use: social drinker No drug use.  Exercise: not active  She does get adequate calcium and Vitamin D in her diet.  She takes paxil for anxiety. Has stopped it before, on low dose currently.   Patient Active Problem List   Diagnosis Date Noted  . Anxiety     Past Surgical History:  Procedure Laterality Date  . OPEN REDUCTION INTERNAL FIXATION (ORIF) DISTAL RADIAL FRACTURE Left 03/03/2019   Procedure: OPEN REDUCTION INTERNAL FIXATION (ORIF) DISTAL RADIAL FRACTURE;  Surgeon: Hessie Knows, MD;  Location: [facility] ORS;  Service: Orthopedics;  Laterality: Left;    Family History  Problem Relation Age of Onset  . Breast cancer Neg Hx   . Ovarian cancer Neg Hx     Social History   Socioeconomic History  . Marital status: Single    Spouse name: Not on file  . Number of children: Not on file  . Years of education: Not on file  . Highest education level: Not on file  Occupational History  . Not on file  Tobacco Use  . Smoking status: Never Smoker  . Smokeless tobacco: Never Used  Substance and Sexual Activity  . Alcohol use: No  . Drug use: No  . Sexual activity: Yes    Birth control/protection:  None  Other Topics Concern  . Not on file  Social History Narrative  . Not on file   Social Determinants of Health   Financial Resource Strain:   . Difficulty of Paying Living Expenses: Not on file  Food Insecurity:   . Worried About Charity fundraiser in the Last Year: Not on file  . Ran Out of Food in the Last Year: Not on file  Transportation Needs:   . Lack of Transportation (Medical): Not on file  . Lack of Transportation (Non-Medical): Not on file  Physical Activity:   . Days of Exercise per Week: Not on file  . Minutes of Exercise per Session: Not on file  Stress:   . Feeling of Stress : Not on file  Social Connections:   . Frequency of Communication with Friends and Family: Not on file  . Frequency of Social Gatherings with Friends and Family: Not on file  . Attends Religious Services: Not on file  . Active Member of Clubs or Organizations: Not on file  . Attends Archivist Meetings: Not on file  . Marital Status: Not on file  Intimate Partner Violence:   . Fear of Current or Ex-Partner: Not on file  . Emotionally Abused: Not on file  . Physically Abused: Not on file  . Sexually Abused: Not on file  Current Outpatient Medications:  .  ALPRAZolam (XANAX) 0.5 MG tablet, as needed. , Disp: , Rfl:  .  PARoxetine (PAXIL) 10 MG tablet, Take by mouth., Disp: , Rfl:     ROS:  Review of Systems  Constitutional: Negative for fatigue, fever and unexpected weight change.  Respiratory: Negative for cough, shortness of breath and wheezing.   Cardiovascular: Negative for chest pain, palpitations and leg swelling.  Gastrointestinal: Negative for blood in stool, constipation, diarrhea, nausea and vomiting.  Endocrine: Negative for cold intolerance, heat intolerance and polyuria.  Genitourinary: Negative for dyspareunia, dysuria, flank pain, frequency, genital sores, hematuria, menstrual problem, pelvic pain, urgency, vaginal bleeding, vaginal discharge and  vaginal pain.  Musculoskeletal: Negative for back pain, joint swelling and myalgias.  Skin: Negative for rash.  Neurological: Negative for dizziness, syncope, light-headedness, numbness and headaches.  Hematological: Negative for adenopathy.  Psychiatric/Behavioral: Positive for agitation. Negative for confusion, sleep disturbance and suicidal ideas. The patient is not nervous/anxious.    BREAST: No symptoms   Objective: BP 120/90   Ht 5\' 4"  (1.626 m)   Wt 205 lb (93 kg)   LMP 06/22/2019 (Exact Date)   BMI 35.19 kg/m    Physical Exam Constitutional:      Appearance: She is well-developed.  Genitourinary:     Vulva, vagina, cervix, uterus, right adnexa and left adnexa normal.     No vulval lesion or tenderness noted.     No vaginal discharge, erythema or tenderness.     No cervical polyp.     Uterus is not enlarged or tender.     No right or left adnexal mass present.     Right adnexa not tender.     Left adnexa not tender.  Neck:     Thyroid: No thyromegaly.  Cardiovascular:     Rate and Rhythm: Normal rate and regular rhythm.     Heart sounds: Normal heart sounds. No murmur.  Pulmonary:     Effort: Pulmonary effort is normal.     Breath sounds: Normal breath sounds.  Chest:     Breasts:        Right: No mass, nipple discharge, skin change or tenderness.        Left: No mass, nipple discharge, skin change or tenderness.  Abdominal:     Palpations: Abdomen is soft.     Tenderness: There is no abdominal tenderness. There is no guarding.  Musculoskeletal:        General: Normal range of motion.     Cervical back: Normal range of motion.  Neurological:     General: No focal deficit present.     Mental Status: She is alert and oriented to person, place, and time.     Cranial Nerves: No cranial nerve deficit.  Skin:    General: Skin is warm and dry.  Psychiatric:        Mood and Affect: Mood normal.        Behavior: Behavior normal.        Thought Content: Thought  content normal.        Judgment: Judgment normal.  Vitals reviewed.      Results: Results for orders placed or performed in visit on 08/02/19 (from the past 24 hour(s))  POCT urine pregnancy     Status: Abnormal   Collection Time: 08/02/19  2:32 PM  Result Value Ref Range   Preg Test, Ur Positive (A) Negative    Assessment/Plan: Encounter for annual routine gynecological examination  Cervical  cancer screening - Plan: Cytology - PAP with STD  Screening for STD (sexually transmitted disease) - Plan: Cytology - PAP  Late menses - Plan: POCT urine pregnancy; Pos UPT. RTO for NOB, start PNVs. Wean off paxil.   Positive pregnancy test--EDD 03/26/20 per LMP.  Anxiety--pt on paxil. Wean off now and f/u at NOB if needs prozac/zoloft for sx.   Needs flu shot - Plan: Flu Vaccine QUAD 36+ mos IM (Fluarix, Quad PF)    GYN counsel adequate intake of calcium and vitamin D, diet and exercise     F/U  Return in about 2 weeks (around 08/16/2019) for NOB.  Davion Meara B. Evva Din, PA-C 08/02/2019 2:34 PM

## 2019-08-02 ENCOUNTER — Ambulatory Visit (INDEPENDENT_AMBULATORY_CARE_PROVIDER_SITE_OTHER): Payer: BC Managed Care – PPO | Admitting: Obstetrics and Gynecology

## 2019-08-02 ENCOUNTER — Other Ambulatory Visit: Payer: Self-pay

## 2019-08-02 ENCOUNTER — Other Ambulatory Visit (HOSPITAL_COMMUNITY)
Admission: RE | Admit: 2019-08-02 | Discharge: 2019-08-02 | Disposition: A | Discharge status: Home / Self Care | Payer: BC Managed Care – PPO | Source: Ambulatory Visit | Attending: Obstetrics and Gynecology | Admitting: Obstetrics and Gynecology

## 2019-08-02 ENCOUNTER — Encounter: Payer: Self-pay | Admitting: Obstetrics and Gynecology

## 2019-08-02 VITALS — BP 120/90 | Ht 64.0 in | Wt 205.0 lb

## 2019-08-02 DIAGNOSIS — Z3201 Encounter for pregnancy test, result positive: Secondary | ICD-10-CM

## 2019-08-02 DIAGNOSIS — Z124 Encounter for screening for malignant neoplasm of cervix: Secondary | ICD-10-CM

## 2019-08-02 DIAGNOSIS — F419 Anxiety disorder, unspecified: Secondary | ICD-10-CM

## 2019-08-02 DIAGNOSIS — Z113 Encounter for screening for infections with a predominantly sexual mode of transmission: Secondary | ICD-10-CM | POA: Insufficient documentation

## 2019-08-02 DIAGNOSIS — Z01419 Encounter for gynecological examination (general) (routine) without abnormal findings: Secondary | ICD-10-CM | POA: Diagnosis not present

## 2019-08-02 DIAGNOSIS — Z23 Encounter for immunization: Secondary | ICD-10-CM

## 2019-08-02 DIAGNOSIS — N926 Irregular menstruation, unspecified: Secondary | ICD-10-CM

## 2019-08-02 DIAGNOSIS — N925 Other specified irregular menstruation: Secondary | ICD-10-CM | POA: Diagnosis not present

## 2019-08-02 LAB — POCT URINE PREGNANCY: Preg Test, Ur: POSITIVE — AB

## 2019-08-02 NOTE — Patient Instructions (Signed)
I value your feedback and entrusting us with your care. If you get a [facility] patient survey, I would appreciate you taking the time to let us know about your experience today. Thank you!  As of June 01, 2019, your lab results will be released to your MyChart immediately, before I even have a chance to see them. Please give me time to review them and contact you if there are any abnormalities. Thank you for your patience.  

## 2019-08-03 ENCOUNTER — Encounter: Payer: Self-pay | Admitting: Obstetrics and Gynecology

## 2019-08-04 LAB — CYTOLOGY - PAP
Chlamydia: NEGATIVE
Comment: NEGATIVE
Comment: NORMAL
Diagnosis: NEGATIVE
Neisseria Gonorrhea: NEGATIVE

## 2019-08-06 ENCOUNTER — Encounter: Payer: Self-pay | Admitting: Obstetrics and Gynecology

## 2019-08-18 ENCOUNTER — Other Ambulatory Visit: Payer: Self-pay

## 2019-08-18 ENCOUNTER — Encounter: Payer: Self-pay | Admitting: Advanced Practice Midwife

## 2019-08-18 ENCOUNTER — Ambulatory Visit (INDEPENDENT_AMBULATORY_CARE_PROVIDER_SITE_OTHER): Payer: BC Managed Care – PPO | Admitting: Advanced Practice Midwife

## 2019-08-18 ENCOUNTER — Other Ambulatory Visit (HOSPITAL_COMMUNITY)
Admission: RE | Admit: 2019-08-18 | Discharge: 2019-08-18 | Disposition: A | Discharge status: Home / Self Care | Payer: BC Managed Care – PPO | Source: Ambulatory Visit | Attending: Advanced Practice Midwife | Admitting: Advanced Practice Midwife

## 2019-08-18 VITALS — BP 118/78 | Wt 209.0 lb

## 2019-08-18 DIAGNOSIS — Z6835 Body mass index (BMI) 35.0-35.9, adult: Secondary | ICD-10-CM

## 2019-08-18 DIAGNOSIS — N898 Other specified noninflammatory disorders of vagina: Secondary | ICD-10-CM | POA: Insufficient documentation

## 2019-08-18 DIAGNOSIS — Z113 Encounter for screening for infections with a predominantly sexual mode of transmission: Secondary | ICD-10-CM | POA: Diagnosis present

## 2019-08-18 DIAGNOSIS — Z131 Encounter for screening for diabetes mellitus: Secondary | ICD-10-CM

## 2019-08-18 DIAGNOSIS — Z3400 Encounter for supervision of normal first pregnancy, unspecified trimester: Secondary | ICD-10-CM

## 2019-08-18 DIAGNOSIS — O0990 Supervision of high risk pregnancy, unspecified, unspecified trimester: Secondary | ICD-10-CM | POA: Insufficient documentation

## 2019-08-18 DIAGNOSIS — O99210 Obesity complicating pregnancy, unspecified trimester: Secondary | ICD-10-CM

## 2019-08-18 DIAGNOSIS — O99211 Obesity complicating pregnancy, first trimester: Secondary | ICD-10-CM

## 2019-08-18 DIAGNOSIS — Z3401 Encounter for supervision of normal first pregnancy, first trimester: Secondary | ICD-10-CM

## 2019-08-18 DIAGNOSIS — Z3A01 Less than 8 weeks gestation of pregnancy: Secondary | ICD-10-CM

## 2019-08-18 LAB — OB RESULTS CONSOLE GC/CHLAMYDIA: Gonorrhea: NEGATIVE

## 2019-08-18 NOTE — Progress Notes (Signed)
NOB today. LMP 06/22/2019

## 2019-08-18 NOTE — Progress Notes (Signed)
New Obstetric Patient H&P    Chief Complaint: "Desires prenatal care"   History of Present Illness: Patient is a 24 y.o. G1P0000 Not Hispanic or Latino female, presents with amenorrhea and positive home pregnancy test. Patient's last menstrual period was 06/22/2019 (exact date). and based on her  LMP, her EDD is Estimated Date of Delivery: 03/28/20 and her EGA is [redacted]w[redacted]d. Cycles are 7. days, regular, and occur approximately every : 28 days. Her last pap smear was 2 weeks ago and was no abnormalities.    She had a urine pregnancy test which was positive 2 week(s)  ago. Her last menstrual period was normal and lasted for  7 day(s). Since her LMP she claims she has experienced breast tenderness, fatigue. She has had brown vaginal bleeding when she wipes since her annual exam 2 weeks ago. She has also noticed vaginal itching in the past week especially at night. Her past medical history is noncontributory. This is her first pregnancy.  Since her LMP, she admits to the use of tobacco products  no She claims she has gained   9 pounds since the start of her pregnancy.  There are cats in the home in the home  no  She admits close contact with children on a regular basis  no  She has had chicken pox in the past no She has had Tuberculosis exposures, symptoms, or previously tested positive for TB   no Current or past history of domestic violence. no  Genetic Screening/Teratology Counseling: (Includes patient, baby's father, or anyone in either family with:)   69. Patient's age >/= 46 [facility]  no 2. Thalassemia ([place], [place], [place], or Asian background): MCV<80  no 3. Neural tube defect (meningomyelocele, spina bifida, anencephaly)  no 4. Congenital heart defect  no  5. Down syndrome  no 6. Tay-Sachs (Jewish, [place])  no 7. Canavan's Disease  no 8. Sickle cell disease or trait (African)  no  9. Hemophilia or other blood disorders  no  10. Muscular dystrophy  no  11. Cystic  fibrosis  no  12. Huntington's Chorea  no  13. Mental retardation/autism  no 14. Other inherited genetic or chromosomal disorder  no 15. Maternal metabolic disorder (DM, PKU, etc)  no 16. Patient or FOB with a child with a birth defect not listed above no  16a. Patient or FOB with a birth defect themselves no 17. Recurrent pregnancy loss, or stillbirth  no  18. Any medications since LMP other than prenatal vitamins (include vitamins, supplements, OTC meds, drugs, alcohol)  no 19. Any other genetic/environmental exposure to discuss  no  Infection History:   1. Lives with someone with TB or TB exposed  no  2. Patient or partner has history of genital herpes  no 3. Rash or viral illness since LMP  no 4. History of STI (GC, CT, HPV, syphilis, HIV)  no 5. 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    Review of Systems:10 point review of systems negative unless otherwise noted in HPI  Past Medical History:  Past Medical History:  Diagnosis Date  . Anxiety     Past Surgical History:  Past Surgical History:  Procedure Laterality Date  . OPEN REDUCTION INTERNAL FIXATION (ORIF) DISTAL RADIAL FRACTURE Left 03/03/2019   Procedure: OPEN REDUCTION INTERNAL FIXATION (ORIF) DISTAL RADIAL FRACTURE;  Surgeon: Hessie Knows, MD;  Location: [facility]  ORS;  Service: Orthopedics;  Laterality: Left;    Gynecologic History: Patient's last menstrual period was 06/22/2019 (exact date).  Obstetric History: G1P0000  Family History:  Family History  Problem Relation Age of Onset  . Breast cancer Neg Hx   . Ovarian cancer Neg Hx     Social History:  Social History   Socioeconomic History  . Marital status: Single    Spouse name: Not on file  . Number of children: Not on file  . Years of education: Not on file  . Highest education level: Not on file  Occupational History  . Not on file  Tobacco Use  .  Smoking status: Never Smoker  . Smokeless tobacco: Never Used  Substance and Sexual Activity  . Alcohol use: No  . Drug use: No  . Sexual activity: Yes    Birth control/protection: None  Other Topics Concern  . Not on file  Social History Narrative  . Not on file   Social Determinants of Health   Financial Resource Strain:   . Difficulty of Paying Living Expenses: Not on file  Food Insecurity:   . Worried About Charity fundraiser in the Last Year: Not on file  . Ran Out of Food in the Last Year: Not on file  Transportation Needs:   . Lack of Transportation (Medical): Not on file  . Lack of Transportation (Non-Medical): Not on file  Physical Activity:   . Days of Exercise per Week: Not on file  . Minutes of Exercise per Session: Not on file  Stress:   . Feeling of Stress : Not on file  Social Connections:   . Frequency of Communication with Friends and Family: Not on file  . Frequency of Social Gatherings with Friends and Family: Not on file  . Attends Religious Services: Not on file  . Active Member of Clubs or Organizations: Not on file  . Attends Archivist Meetings: Not on file  . Marital Status: Not on file  Intimate Partner Violence:   . Fear of Current or Ex-Partner: Not on file  . Emotionally Abused: Not on file  . Physically Abused: Not on file  . Sexually Abused: Not on file    Allergies:  No Known Allergies  Medications: Prior to Admission medications   Medication Sig Start Date End Date Taking? Authorizing Provider  ALPRAZolam Duanne Moron) 0.5 MG tablet as needed.  02/08/19   [provider]  PARoxetine (PAXIL) 10 MG tablet Take by mouth. 05/20/16   [provider]    Physical Exam Vitals: Blood pressure 118/78, weight 209 lb (94.8 kg), last menstrual period 06/22/2019.  General: NAD HEENT: normocephalic, anicteric Thyroid: no enlargement, no palpable nodules Pulmonary: No increased work of breathing, CTAB Cardiovascular:  RRR, distal pulses 2+ Abdomen: NABS, soft, non-tender, non-distended.  Umbilicus without lesions.  No hepatomegaly, splenomegaly or masses palpable. No evidence of hernia  Genitourinary:  External: Normal external female genitalia.  Normal urethral meatus, normal  Bartholin's and Skene's glands.    Vagina: Normal vaginal mucosa, no evidence of prolapse.    Cervix: Grossly normal in appearance, no bleeding, no CMT  Uterus: Non-enlarged, mobile, normal contour.    Adnexa: ovaries non-enlarged, no adnexal masses  Rectal: deferred Extremities: no edema, erythema, or tenderness Neurologic: Grossly intact Psychiatric: mood appropriate, affect full   The following were addressed during this visit:  Breastfeeding Education - Early initiation of breastfeeding    Comments: Keeps milk supply adequate, helps contract uterus and  slow bleeding, and early milk is the perfect first food and is easy to digest.   - The importance of exclusive breastfeeding    Comments: Provides antibodies, Lower risk of breast and ovarian cancers, and type-2 diabetes,Helps your body recover, Reduced chance of SIDS.   - Risks of giving your baby anything other than breast milk if you are breastfeeding    Comments: Make the baby less content with breastfeeds, may make my baby more susceptible to illness, and may reduce my milk supply.   - The importance of early skin-to-skin contact    Comments: Keeps baby warm and secure, helps keep baby's blood sugar up and breathing steady, easier to bond and breastfeed, and helps calm baby.  - Effective positioning and attachment    Comments: Helps my baby to get enough breast milk, helps to produce an adequate milk supply, and helps prevent nipple pain and damage   - Exclusive breastfeeding for the first 6 months    Comments: Builds a healthy milk supply and keeps it up, protects baby from sickness and disease, and breastmilk has everything your baby needs for the first 6  months.    Assessment: 24 y.o. G1P0000 at [redacted]w[redacted]d by LMP presenting to initiate prenatal care  Plan: 1) Avoid alcoholic beverages. 2) Patient encouraged not to smoke.  3) Discontinue the use of all non-medicinal drugs and chemicals.  4) Take prenatal vitamins daily.  5) Nutrition, food safety (fish, cheese advisories, and high nitrite foods) and exercise discussed. 6) Hospital and practice style discussed with cross coverage system.  7) Genetic Screening, such as with 1st Trimester Screening, cell free fetal DNA, AFP testing, and Ultrasound, as well as with amniocentesis and CVS as appropriate, is discussed with patient. At the conclusion of today's visit patient is undecided about 1st trimester genetic testing 8) Patient is asked about travel to areas at risk for the [place] virus, and counseled to avoid travel and exposure to mosquitoes or sexual partners who may have themselves been exposed to the virus. Testing is discussed, and will be ordered as appropriate.  9) Aptima +vaginitis, urine culture today (follow up as needed regarding vaginitis) 10) Return to clinic in 1 week for early 1 hr gtt, NOB panel, dating scan and rob 38) 1st trimester screen/NT at 12 weeks if desired   Rod Can, [facility] 08/18/2019, 3:53 PM

## 2019-08-18 NOTE — Patient Instructions (Signed)
Exercise During Pregnancy Exercise is an important part of being healthy for people of all ages. Exercise improves the function of your heart and lungs and helps you maintain strength, flexibility, and a healthy body weight. Exercise also boosts energy levels and elevates mood. Most women should exercise regularly during pregnancy. In rare cases, women with certain medical conditions or complications may be asked to limit or avoid exercise during pregnancy. How does this affect me? Along with maintaining general strength and flexibility, exercising during pregnancy can help:  Keep strength in muscles that are used during labor and childbirth.  Decrease low back pain.  Reduce symptoms of depression.  Control weight gain during pregnancy.  Reduce the risk of needing insulin if you develop diabetes during pregnancy.  Decrease the risk of cesarean delivery.  Speed up your recovery after giving birth. How does this affect my baby? Exercise can help you have a healthy pregnancy. Exercise does not cause premature birth. It will not cause your baby to weigh less at birth. What exercises can I do? Many exercises are safe for you to do during pregnancy. Do a variety of exercises that safely increase your heart and breathing rates and help you build and maintain muscle strength. Do exercises exactly as told by your health care provider. You may do these exercises:  Walking or hiking.  Swimming.  Water aerobics.  Riding a stationary bike.  Strength training.  Modified yoga or Pilates. Tell your instructor that you are pregnant. Avoid overstretching, and avoid lying on your back for long periods of time.  Running or jogging. Only choose this type of exercise if you: ? Ran or jogged regularly before your pregnancy. ? Can run or jog and still talk in complete sentences. What exercises should I avoid? Depending on your level of fitness and whether you exercised regularly before your  pregnancy, you may be told to limit high-intensity exercise. You can tell that you are exercising at a high intensity if you are breathing much harder and faster and cannot hold a conversation while exercising. You must avoid:  Contact sports.  Activities that put you at risk for falling on or being hit in the belly, such as downhill skiing, water skiing, surfing, rock climbing, cycling, gymnastics, and horseback riding.  Scuba diving.  Skydiving.  Yoga or Pilates in a room that is heated to high temperatures.  Jogging or running, unless you ran or jogged regularly before your pregnancy. While jogging or running, you should always be able to talk in full sentences. Do not run or jog so fast that you are unable to have a conversation.  Do not exercise at more than 6,000 feet above sea level (high elevation) if you are not used to exercising at high elevation. How do I exercise in a safe way?   Avoid overheating. Do not exercise in very high temperatures.  Wear loose-fitting, breathable clothes.  Avoid dehydration. Drink enough water before, during, and after exercise to keep your urine pale yellow.  Avoid overstretching. Because of hormone changes during pregnancy, it is easy to overstretch muscles, tendons, and ligaments during pregnancy.  Start slowly and ask your health care provider to recommend the types of exercise that are safe for you.  Do not exercise to lose weight. Follow these instructions at home:  Exercise on most days or all days of the week. Try to exercise for 30 minutes a day, 5 days a week, unless your health care provider tells you not to.  If  you actively exercised before your pregnancy and you are healthy, your health care provider may tell you to continue to do moderate to high-intensity exercise.  If you are just starting to exercise or did not exercise much before your pregnancy, your health care provider may tell you to do low to moderate-intensity  exercise. Questions to ask your health care provider  Is exercise safe for me?  What are signs that I should stop exercising?  Does my health condition mean that I should not exercise during pregnancy?  When should I avoid exercising during pregnancy? Stop exercising and contact a health care provider if: You have any unusual symptoms, such as:  Mild contractions of the uterus or cramps in the abdomen.  Dizziness that does not go away when you rest. Stop exercising and get help right away if: You have any unusual symptoms, such as:  Sudden, severe pain in your low back or your belly.  Mild contractions of the uterus or cramps in the abdomen that do not improve with rest and drinking fluids.  Chest pain.  Bleeding or fluid leaking from your vagina.  Shortness of breath. These symptoms may represent a serious problem that is an emergency. Do not wait to see if the symptoms will go away. Get medical help right away. Call your local emergency services (911 in the [place] Do not drive yourself to the hospital. Summary  Most women should exercise regularly throughout pregnancy. In rare cases, women with certain medical conditions or complications may be asked to limit or avoid exercise during pregnancy.  Do not exercise to lose weight during pregnancy.  Your health care provider will tell you what level of physical activity is right for you.  Stop exercising and contact a health care provider if you have mild contractions of the uterus or cramps in the abdomen. Get help right away if these contractions or cramps do not improve with rest and drinking fluids.  Stop exercising and get help right away if you have sudden, severe pain in your low back or belly, chest pain, shortness of breath, or bleeding or leaking of fluid from your vagina. This information is not intended to replace advice given to you by your health care provider. Make sure you discuss any questions you have with your  health care provider. Document Revised: 09/29/2018 Document Reviewed: 07/13/2018 Elsevier Patient Education  2020 [employer] Eating Plan for Pregnant Women While you are pregnant, your body requires additional nutrition to help support your growing baby. You also have a higher need for some vitamins and minerals, such as folic acid, calcium, iron, and vitamin D. Eating a healthy, well-balanced diet is very important for your health and your baby's health. Your need for extra calories varies for the three 3-month segments of your pregnancy (trimesters). For most women, it is recommended to consume:  150 extra calories a day during the first trimester.  300 extra calories a day during the second trimester.  300 extra calories a day during the third trimester. What are tips for following this plan?   Do not try to lose weight or go on a diet during pregnancy.  Limit your overall intake of foods that have "empty calories." These are foods that have little nutritional value, such as sweets, desserts, candies, and sugar-sweetened beverages.  Eat a variety of foods (especially fruits and vegetables) to get a full range of vitamins and minerals.  Take a prenatal vitamin to help meet your additional vitamin and mineral needs   during pregnancy, specifically for folic acid, iron, calcium, and vitamin D.  Remember to stay active. Ask your health care provider what types of exercise and activities are safe for you.  Practice good food safety and cleanliness. Wash your hands before you eat and after you prepare raw meat. Wash all fruits and vegetables well before peeling or eating. Taking these actions can help to prevent food-borne illnesses that can be very dangerous to your baby, such as listeriosis. Ask your health care provider for more information about listeriosis. What does 150 extra calories look like? Healthy options that provide 150 extra calories each day could be any of the  following:  6-8 oz (170-230 g) of plain low-fat yogurt with  cup of berries.  1 apple with 2 teaspoons (11 g) of peanut butter.  Cut-up vegetables with  cup (60 g) of hummus.  8 oz (230 mL) or 1 cup of low-fat chocolate milk.  1 stick of string cheese with 1 medium orange.  1 peanut butter and jelly sandwich that is made with one slice of whole-wheat bread and 1 tsp (5 g) of peanut butter. For 300 extra calories, you could eat two of those healthy options each day. What is a healthy amount of weight to gain? The right amount of weight gain for you is based on your BMI before you became pregnant. If your BMI:  Was less than 18 (underweight), you should gain 28-40 lb (13-18 kg).  Was 18-24.9 (normal), you should gain 25-35 lb (11-16 kg).  Was 25-29.9 (overweight), you should gain 15-25 lb (7-11 kg).  Was 30 or greater (obese), you should gain 11-20 lb (5-9 kg). What if I am having twins or multiples? Generally, if you are carrying twins or multiples:  You may need to eat 300-600 extra calories a day.  The recommended range for total weight gain is 25-54 lb (11-25 kg), depending on your BMI before pregnancy.  Talk with your health care provider to find out about nutritional needs, weight gain, and exercise that is right for you. What foods can I eat?  Fruits All fruits. Eat a variety of colors and types of fruit. Remember to wash your fruits well before peeling or eating. Vegetables All vegetables. Eat a variety of colors and types of vegetables. Remember to wash your vegetables well before peeling or eating. Grains All grains. Choose whole grains, such as whole-wheat bread, oatmeal, or brown rice. Meats and other protein foods Lean meats, including chicken, turkey, fish, and lean cuts of beef, veal, or pork. If you eat fish or seafood, choose options that are higher in omega-3 fatty acids and lower in mercury, such as salmon, herring, mussels, trout, sardines, pollock,  shrimp, crab, and lobster. Tofu. Tempeh. Beans. Eggs. Peanut butter and other nut butters. Make sure that all meats, poultry, and eggs are cooked to food-safe temperatures or "well-done." Two or more servings of fish are recommended each week in order to get the most benefits from omega-3 fatty acids that are found in seafood. Choose fish that are lower in mercury. You can find more information online:  www.fda.gov Dairy Pasteurized milk and milk alternatives (such as almond milk). Pasteurized yogurt and pasteurized cheese. Cottage cheese. Sour cream. Beverages Water. Juices that contain 100% fruit juice or vegetable juice. Caffeine-free teas and decaffeinated coffee. Drinks that contain caffeine are okay to drink, but it is better to avoid caffeine. Keep your total caffeine intake to less than 200 mg each day (which is 12 oz   or 355 mL of coffee, tea, or soda) or the limit as told by your health care provider. Fats and oils Fats and oils are okay to include in moderation. Sweets and desserts Sweets and desserts are okay to include in moderation. Seasoning and other foods All pasteurized condiments. The items listed above may not be a complete list of foods and beverages you can eat. Contact a dietitian for more information. What foods are not recommended? Fruits Unpasteurized fruit juices. Vegetables Raw (unpasteurized) vegetable juices. Meats and other protein foods Lunch meats, bologna, hot dogs, or other deli meats. (If you must eat those meats, reheat them until they are steaming hot.) Refrigerated pat, meat spreads from a meat counter, smoked seafood that is found in the refrigerated section of a store. Raw or undercooked meats, poultry, and eggs. Raw fish, such as sushi or sashimi. Fish that have high mercury content, such as tilefish, shark, swordfish, and king mackerel. To learn more about mercury in fish, talk with your health care provider or look for online resources, such  as:  www.fda.gov Dairy Raw (unpasteurized) milk and any foods that have raw milk in them. Soft cheeses, such as feta, queso blanco, queso fresco, Brie, Camembert cheeses, blue-veined cheeses, and Panela cheese (unless it is made with pasteurized milk, which must be stated on the label). Beverages Alcohol. Sugar-sweetened beverages, such as sodas, teas, or energy drinks. Seasoning and other foods Homemade fermented foods and drinks, such as pickles, sauerkraut, or kombucha drinks. (Store-bought pasteurized versions of these are okay.) Salads that are made in a store or deli, such as ham salad, chicken salad, egg salad, tuna salad, and seafood salad. The items listed above may not be a complete list of foods and beverages you should avoid. Contact a dietitian for more information. Where to find more information To calculate the number of calories you need based on your height, weight, and activity level, you can use an online calculator such as:  www.choosemyplate.gov/MyPlatePlan To calculate how much weight you should gain during pregnancy, you can use an online pregnancy weight gain calculator such as:  www.choosemyplate.gov/pregnancy-weight-gain-calculator Summary  While you are pregnant, your body requires additional nutrition to help support your growing baby.  Eat a variety of foods, especially fruits and vegetables to get a full range of vitamins and minerals.  Practice good food safety and cleanliness. Wash your hands before you eat and after you prepare raw meat. Wash all fruits and vegetables well before peeling or eating. Taking these actions can help to prevent food-borne illnesses, such as listeriosis, that can be very dangerous to your baby.  Do not eat raw meat or fish. Do not eat fish that have high mercury content, such as tilefish, shark, swordfish, and king mackerel. Do not eat unpasteurized (raw) dairy.  Take a prenatal vitamin to help meet your additional vitamin and  mineral needs during pregnancy, specifically for folic acid, iron, calcium, and vitamin D. This information is not intended to replace advice given to you by your health care provider. Make sure you discuss any questions you have with your health care provider. Document Revised: 10/27/2018 Document Reviewed: 03/05/2017 Elsevier Patient Education  2020 Elsevier Inc. Prenatal Care Prenatal care is health care during pregnancy. It helps you and your unborn baby (fetus) stay as healthy as possible. Prenatal care may be provided by a midwife, a family practice health care provider, or a childbirth and pregnancy specialist (obstetrician). How does this affect me? During pregnancy, you will be closely monitored   for any new conditions that might develop. To lower your risk of pregnancy complications, you and your health care provider will talk about any underlying conditions you have. How does this affect my baby? Early and consistent prenatal care increases the chance that your baby will be healthy during pregnancy. Prenatal care lowers the risk that your baby will be:  Born early (prematurely).  Smaller than expected at birth (small for gestational age). What can I expect at the first prenatal care visit? Your first prenatal care visit will likely be the longest. You should schedule your first prenatal care visit as soon as you know that you are pregnant. Your first visit is a good time to talk about any questions or concerns you have about pregnancy. At your visit, you and your health care provider will talk about:  Your medical history, including: ? Any past pregnancies. ? Your family's medical history. ? The baby's father's medical history. ? Any long-term (chronic) health conditions you have and how you manage them. ? Any surgeries or procedures you have had. ? Any current over-the-counter or prescription medicines, herbs, or supplements you are taking.  Other factors that could pose a risk  to your baby, including:  Your home setting and your stress levels, including: ? Exposure to abuse or violence. ? Household financial strain. ? Mental health conditions you have.  Your daily health habits, including diet and exercise. Your health care provider will also:  Measure your weight, height, and blood pressure.  Do a physical exam, including a pelvic and breast exam.  Perform blood tests and urine tests to check for: ? Urinary tract infection. ? Sexually transmitted infections (STIs). ? Low iron levels in your blood (anemia). ? Blood type and certain proteins on red blood cells (Rh antibodies). ? Infections and immunity to viruses, such as hepatitis B and rubella. ? HIV (human immunodeficiency virus).  Do an ultrasound to confirm your baby's growth and development and to help predict your estimated due date (EDD). This ultrasound is done with a probe that is inserted into the vagina (transvaginal ultrasound).  Discuss your options for genetic screening.  Give you information about how to keep yourself and your baby healthy, including: ? Nutrition and taking vitamins. ? Physical activity. ? How to manage pregnancy symptoms such as nausea and vomiting (morning sickness). ? Infections and substances that may be harmful to your baby and how to avoid them. ? Food safety. ? Dental care. ? Working. ? Travel. ? Warning signs to watch for and when to call your health care provider. How often will I have prenatal care visits? After your first prenatal care visit, you will have regular visits throughout your pregnancy. The visit schedule is often as follows:  Up to week 28 of pregnancy: once every 4 weeks.  28-36 weeks: once every 2 weeks.  After 36 weeks: every week until delivery. Some women may have visits more or less often depending on any underlying health conditions and the health of the baby. Keep all follow-up and prenatal care visits as told by your health care  provider. This is important. What happens during routine prenatal care visits? Your health care provider will:  Measure your weight and blood pressure.  Check for fetal heart sounds.  Measure the height of your uterus in your abdomen (fundal height). This may be measured starting around week 20 of pregnancy.  Check the position of your baby inside your uterus.  Ask questions about your diet, sleeping patterns, and   whether you can feel the baby move.  Review warning signs to watch for and signs of labor.  Ask about any pregnancy symptoms you are having and how you are dealing with them. Symptoms may include: ? Headaches. ? Nausea and vomiting. ? Vaginal discharge. ? Swelling. ? Fatigue. ? Constipation. ? Any discomfort, including back or pelvic pain. Make a list of questions to ask your health care provider at your routine visits. What tests might I have during prenatal care visits? You may have blood, urine, and imaging tests throughout your pregnancy, such as:  Urine tests to check for glucose, protein, or signs of infection.  Glucose tests to check for a form of diabetes that can develop during pregnancy (gestational diabetes mellitus). This is usually done around week 24 of pregnancy.  An ultrasound to check your baby's growth and development and to check for birth defects. This is usually done around week 20 of pregnancy.  A test to check for group B strep (GBS) infection. This is usually done around week 36 of pregnancy.  Genetic testing. This may include blood or imaging tests, such as an ultrasound. Some genetic tests are done during the first trimester and some are done during the second trimester. What else can I expect during prenatal care visits? Your health care provider may recommend getting certain vaccines during pregnancy. These may include:  A yearly flu shot (annual influenza vaccine). This is especially important if you will be pregnant during flu  season.  Tdap (tetanus, diphtheria, pertussis) vaccine. Getting this vaccine during pregnancy can protect your baby from whooping cough (pertussis) after birth. This vaccine may be recommended between weeks 27 and 36 of pregnancy. Later in your pregnancy, your health care provider may give you information about:  Childbirth and breastfeeding classes.  Choosing a health care provider for your baby.  Umbilical cord banking.  Breastfeeding.  Birth control after your baby is born.  The hospital labor and delivery unit and how to tour it.  Registering at the hospital before you go into labor. Where to find more information  Office on Women's Health: LegalWarrants.gl  American Pregnancy Association: americanpregnancy.org  March of Dimes: marchofdimes.org Summary  Prenatal care helps you and your baby stay as healthy as possible during pregnancy.  Your first prenatal care visit will most likely be the longest.  You will have visits and tests throughout your pregnancy to monitor your health and your baby's health.  Bring a list of questions to your visits to ask your health care provider.  Make sure to keep all follow-up and prenatal care visits with your health care provider. This information is not intended to replace advice given to you by your health care provider. Make sure you discuss any questions you have with your health care provider. Document Revised: 09/28/2018 Document Reviewed: 06/07/2017 Elsevier Patient Education  [place].     COVID-19 and Your Pregnancy FAQ  How can I prevent infection with COVID-19 during my pregnancy? Social distancing is key. Please limit any interactions in public. Try and work from home if possible. Frequently wash your hands after touching possibly contaminated surfaces. Avoid touching your face.  Minimize trips to the store. Consider online ordering when possible.   Should I wear a mask? YES. It is recommended by the CDC  that all people wear a cloth mask or facial covering in public. You should wear a mask to your visits in the office. This will help reduce transmission as well as your  risk or acquiring COVID-19. New studies are showing that even asymptomatic individuals can spread the virus from talking.   Where can I get a mask? [place] and the city of [place] are partnering to provide masks to community members. You can pick up a mask from several locations. This website also has instructions about how to make a mask by sewing or without sewing by using a t-shirt or bandana.  https://www.[place].gov/i-want-to/learn-about/covid-19-information-and-updates/covid-19-face-mask-project  Studies have shown that if you were a tube or nylon stocking from pantyhose over a cloth mask it makes the cloth mask almost as effective as a N95 mask.  https://www.davis-walter.com/  What are the symptoms of COVID-19? Fever (greater than 100.4 F), dry cough, shortness of breath.  Am I more at risk for COVID-19 since I am pregnant? There is not currently data showing that pregnant women are more adversely impacted by COVID-19 than the general population. However, we know that pregnant women tend to have worse respiratory complications from similar diseases such as the flu and SARS and for this reason should be considered an at-risk population.  What do I do if I am experiencing the symptoms of COVID-19? Testing is being limited because of test availability. If you are experiencing symptoms you should quarantine yourself, and the members of your family, for at least 2 weeks at home.   Please visit this website for more information: [email or web address].[email or web address].za.html  When should I go to the Emergency Room? Please go to the emergency room if you are experiencing  ANY of these symptoms*:  1.    Difficulty breathing or shortness of breath 2.    Persistent pain or pressure in the chest 3.    Confusion or difficulty being aroused (or awakened) 4.    Bluish lips or face  *This list is not all inclusive. Please consult our office for any other symptoms that are severe or concerning.  What do I do if I am having difficulty breathing? You should go to the Emergency Room for evaluation. At this time they have a tent set up for evaluating patients with COVID-19 symptoms.   How will my prenatal care be different because of the COVID-19 pandemic? It has been recommended to reduce the frequency of face-to-face visits and use resources such as telephone and virtual visits when possible. Using a scale, blood pressure machine and fetal doppler at home can further help reduce face-to-face visits. You will be provided with additional information on this topic.  We ask that you come to your visits alone to minimize potential exposures to  COVID-19.  How can I receive childbirth education? At this time in-person classes have been cancelled. You can register for online childbirth education, breastfeeding, and newborn care classes.  Please visit:  [employer].hu for more information  How will my hospital birth experience be different? The hospital is currently limiting visitors. This means that while you are in labor you can only have one person at the hospital with you. Additional family members will not be allowed to wait in the building or outside your room. Your one support person can be the father of the baby, a relative, a doula, or a friend. Once one support person is designated that person will wear a band. This band cannot be shared with multiple people.  Nitrous Gas is not being offered for pain relief since the tubing and filter for the machine can not be sanitized in a way to guarantee prevention of transmission of COVID-19.  Nasal cannula use of  oxygen for fetal indications has also been discontinued.  Currently a clear plastic sheet is being hung between mom and the delivering provider during pushing and delivery to help prevent transmission of COVID-19.      How long will I stay in the hospital for after giving birth? It is also recommended that discharge home be expedited during the COVID-19 outbreak. This means staying for 1 day after a vaginal delivery and 2 days after a cesarean section. Patients who need to stay longer for medical reasons are allowed to do so, but the goal will be for expedited discharge home.   What if I have COVID-19 and I am in labor? We ask that you wear a mask while on labor and delivery. We will try and accommodate you being placed in a room that is capable of filtering the air. Please call ahead if you are in labor and on your way to the hospital. The phone number for labor and delivery at [facility] is [phone].  If I have COVID-19 when my baby is born how can I prevent my baby from contracting COVID-19? This is an issue that will have to be discussed on a case-by-case basis. Current recommendations suggest providing separate isolation rooms for both the mother and new infant as well as limiting visitors. However, there are practical challenges to this recommendation. The situation will assuredly change and decisions will be influenced by the desires of the mother and availability of space.  Some suggestions are the use of a curtain or physical barrier between mom and infant, hand hygiene, mom wearing a mask, or 6 feet of spacing between a mom and infant.   Can I breastfeed during the COVID-19 pandemic?   Yes, breastfeeding is encouraged.  Can I breastfeed if I have COVID-19? Yes. Covid-19 has not been found in breast milk. This means you cannot give COVID-19 to your child through breast milk. Breast feeding will also help pass antibodies to fight infection to your baby.   What  precautions should I take when breastfeeding if I have COVID-19? If a mother and newborn do room-in and the mother wishes to feed at the breast, she should put on a facemask and practice hand hygiene before each feeding.  What precautions should I take when pumping if I have COVID-19? Prior to expressing breast milk, mothers should practice hand hygiene. After each pumping session, all parts that come into contact with breast milk should be thoroughly washed and the entire pump should be appropriately disinfected per the manufacturer's instructions. This expressed breast milk should be fed to the newborn by a healthy caregiver.  What if I am pregnant and work in healthcare? Based on limited data regarding COVID-19 and pregnancy, ACOG currently does not propose creating additional restrictions on pregnant health care personnel because of COVID-19 alone. Pregnant women do not appear to be at higher risk of severe disease related to COVID-19. Pregnant health care personnel should follow CDC risk assessment and infection control guidelines for health care personnel exposed to patients with suspected or confirmed COVID-19. Adherence to recommended infection prevention and control practices is an important part of protecting all health care personnel in health care settings.    Information on COVID-19 in pregnancy is very limited; however, facilities may want to consider limiting exposure of pregnant health care personnel to patients with confirmed or suspected COVID-19 infection, especially during higher-risk procedures (eg, aerosol-generating procedures), if feasible, based on staffing availability.

## 2019-08-20 LAB — URINE CULTURE

## 2019-08-22 LAB — CERVICOVAGINAL ANCILLARY ONLY
Bacterial Vaginitis (gardnerella): NEGATIVE
Candida Glabrata: NEGATIVE
Candida Vaginitis: NEGATIVE
Chlamydia: NEGATIVE
Comment: NEGATIVE
Comment: NEGATIVE
Comment: NEGATIVE
Comment: NEGATIVE
Comment: NEGATIVE
Comment: NORMAL
Neisseria Gonorrhea: NEGATIVE
Trichomonas: NEGATIVE

## 2019-09-04 ENCOUNTER — Ambulatory Visit (INDEPENDENT_AMBULATORY_CARE_PROVIDER_SITE_OTHER): Payer: BC Managed Care – PPO

## 2019-09-04 ENCOUNTER — Other Ambulatory Visit: Payer: BC Managed Care – PPO

## 2019-09-04 ENCOUNTER — Ambulatory Visit (INDEPENDENT_AMBULATORY_CARE_PROVIDER_SITE_OTHER): Payer: BC Managed Care – PPO | Admitting: Obstetrics & Gynecology

## 2019-09-04 ENCOUNTER — Other Ambulatory Visit: Payer: Self-pay

## 2019-09-04 ENCOUNTER — Encounter: Payer: Self-pay | Admitting: Obstetrics & Gynecology

## 2019-09-04 VITALS — BP 120/80 | Wt 207.0 lb

## 2019-09-04 DIAGNOSIS — Z3A10 10 weeks gestation of pregnancy: Secondary | ICD-10-CM

## 2019-09-04 DIAGNOSIS — Z3401 Encounter for supervision of normal first pregnancy, first trimester: Secondary | ICD-10-CM

## 2019-09-04 DIAGNOSIS — Z3400 Encounter for supervision of normal first pregnancy, unspecified trimester: Secondary | ICD-10-CM

## 2019-09-04 DIAGNOSIS — Z6835 Body mass index (BMI) 35.0-35.9, adult: Secondary | ICD-10-CM

## 2019-09-04 DIAGNOSIS — D398 Neoplasm of uncertain behavior of other specified female genital organs: Secondary | ICD-10-CM | POA: Diagnosis not present

## 2019-09-04 DIAGNOSIS — Z3689 Encounter for other specified antenatal screening: Secondary | ICD-10-CM | POA: Diagnosis not present

## 2019-09-04 DIAGNOSIS — O3441 Maternal care for other abnormalities of cervix, first trimester: Secondary | ICD-10-CM

## 2019-09-04 DIAGNOSIS — O99210 Obesity complicating pregnancy, unspecified trimester: Secondary | ICD-10-CM

## 2019-09-04 DIAGNOSIS — Z131 Encounter for screening for diabetes mellitus: Secondary | ICD-10-CM

## 2019-09-04 DIAGNOSIS — Z113 Encounter for screening for infections with a predominantly sexual mode of transmission: Secondary | ICD-10-CM

## 2019-09-04 LAB — POCT URINALYSIS DIPSTICK OB
Glucose, UA: NEGATIVE
POC,PROTEIN,UA: NEGATIVE

## 2019-09-04 LAB — OB RESULTS CONSOLE VARICELLA ZOSTER ANTIBODY, IGG: Varicella: IMMUNE

## 2019-09-04 NOTE — Addendum Note (Signed)
Addended by: Quintella Baton D on: 09/04/2019 03:35 PM   Modules accepted: Orders

## 2019-09-04 NOTE — Patient Instructions (Signed)

## 2019-09-04 NOTE — Progress Notes (Signed)
  Subjective  Fetal Movement? no Pain? no Nausea? no Vaginal Bleeding? no  Objective  BP 120/80   Wt 207 lb (93.9 kg)   LMP 06/22/2019 (Exact Date)   BMI 35.53 kg/m  General: NAD Pumonary: no increased work of breathing Abdomen: gravid, non-tender Extremities: no edema Psychiatric: mood appropriate, affect full  Assessment  24 y.o. G1P0000 at [redacted]w[redacted]d by  03/28/2020, by Last Menstrual Period presenting for routine prenatal visit  Plan   Problem List Items Addressed This Visit      Other   Supervision of normal first pregnancy, antepartum    Other Visit Diagnoses    [redacted] weeks gestation of pregnancy    -  Primary    PNV Discussed NIPT.  Declines.  Review of ULTRASOUND.    I have personally reviewed images and report of recent ultrasound done at [facility].    Plan of management to be discussed with patient.  Barnett Applebaum, MD, [facility], [facility] 09/04/2019  3:15 PM

## 2019-09-05 ENCOUNTER — Other Ambulatory Visit: Payer: Self-pay | Admitting: Obstetrics & Gynecology

## 2019-09-05 DIAGNOSIS — O99810 Abnormal glucose complicating pregnancy: Secondary | ICD-10-CM

## 2019-09-05 LAB — RPR+RH+ABO+RUB AB+AB SCR+CB...
Antibody Screen: NEGATIVE
HIV Screen 4th Generation wRfx: NONREACTIVE
Hematocrit: 35.7 % (ref 34.0–46.6)
Hemoglobin: 12.3 g/dL (ref 11.1–15.9)
Hepatitis B Surface Ag: NEGATIVE
MCH: 28.9 pg (ref 26.6–33.0)
MCHC: 34.5 g/dL (ref 31.5–35.7)
MCV: 84 fL (ref 79–97)
Platelets: 229 10*3/uL (ref 150–450)
RBC: 4.26 x10E6/uL (ref 3.77–5.28)
RDW: 13 % (ref 11.7–15.4)
RPR Ser Ql: NONREACTIVE
Rh Factor: POSITIVE
Rubella Antibodies, IGG: 1.19 index (ref >0.99)
Varicella zoster IgG: 1328 index (ref >165)
WBC: 9.3 10*3/uL (ref 3.4–10.8)

## 2019-09-05 LAB — GLUCOSE, 1 HOUR GESTATIONAL: Gestational Diabetes Screen: 172 mg/dL — ABNORMAL HIGH (ref 65–139)

## 2019-09-05 NOTE — Telephone Encounter (Signed)
Sch 3 hour GTT please.

## 2019-09-05 NOTE — Telephone Encounter (Signed)
Please advise 

## 2019-09-05 NOTE — Telephone Encounter (Signed)
Patient is schedule for 09/20/19

## 2019-09-07 ENCOUNTER — Other Ambulatory Visit: Payer: Self-pay | Admitting: Advanced Practice Midwife

## 2019-09-07 DIAGNOSIS — R7309 Other abnormal glucose: Secondary | ICD-10-CM

## 2019-09-07 NOTE — Progress Notes (Unsigned)
MyChart message sent to patient regarding the need for 3 hr gtt. Order has already been placed.

## 2019-09-20 ENCOUNTER — Other Ambulatory Visit: Payer: BC Managed Care – PPO

## 2019-09-20 ENCOUNTER — Other Ambulatory Visit: Payer: Self-pay

## 2019-09-20 DIAGNOSIS — O99810 Abnormal glucose complicating pregnancy: Secondary | ICD-10-CM

## 2019-09-21 LAB — GESTATIONAL GLUCOSE TOLERANCE
Glucose, Fasting: 77 mg/dL (ref 65–94)
Glucose, GTT - 1 Hour: 170 mg/dL (ref 65–179)
Glucose, GTT - 2 Hour: 154 mg/dL (ref 65–154)
Glucose, GTT - 3 Hour: 112 mg/dL (ref 65–139)

## 2019-10-02 ENCOUNTER — Telehealth: Payer: BC Managed Care – PPO | Admitting: Obstetrics & Gynecology

## 2019-10-11 ENCOUNTER — Other Ambulatory Visit: Payer: Self-pay

## 2019-10-11 ENCOUNTER — Telehealth (INDEPENDENT_AMBULATORY_CARE_PROVIDER_SITE_OTHER): Payer: BC Managed Care – PPO | Admitting: Obstetrics and Gynecology

## 2019-10-11 DIAGNOSIS — Z363 Encounter for antenatal screening for malformations: Secondary | ICD-10-CM

## 2019-10-11 DIAGNOSIS — Z3400 Encounter for supervision of normal first pregnancy, unspecified trimester: Secondary | ICD-10-CM

## 2019-10-11 DIAGNOSIS — O99210 Obesity complicating pregnancy, unspecified trimester: Secondary | ICD-10-CM

## 2019-10-11 DIAGNOSIS — Z3A15 15 weeks gestation of pregnancy: Secondary | ICD-10-CM

## 2019-10-11 NOTE — Progress Notes (Signed)
  I connected with Desiree Combs   on 10/11/19 at  1:50 PM EDT by telephone and verified that I am speaking with the correct person using two identifiers.   I discussed the limitations, risks, security and privacy concerns of performing an evaluation and management service by telephone and the availability of in person appointments. I also discussed with the patient that there may be a patient responsible charge related to this service. The patient expressed understanding and agreed to proceed.  The patient was at home I spoke with the patient from my workstation phone The names of people involved in this encounter were: Desiree Combs , and Desiree Combs   Routine Prenatal Care Visit  Subjective  Desiree Combs is a 24 y.o. G1P0000 at [redacted]w[redacted]d being seen today for ongoing prenatal care.  She is currently monitored for the following issues for this low-risk pregnancy and has Anxiety; Supervision of normal first pregnancy, antepartum; and Obesity affecting pregnancy, antepartum on their problem list.  ----------------------------------------------------------------------------------- Patient reports no complaints.    .  .   . Denies leaking of fluid.  ----------------------------------------------------------------------------------- The following portions of the patient's history were reviewed and updated as appropriate: allergies, current medications, past family history, past medical history, past social history, past surgical history and problem list. Problem list updated.   Objective  Last menstrual period 06/22/2019. Pregravid weight 200 lb (90.7 kg) Total Weight Gain 7 lb (3.175 kg) Urinalysis:      Fetal Status:           No physical exam as this was a remote telephone visit to promote social distancing during the current COVID-19 Pandemic   Assessment   24 y.o. G1P0000 at [redacted]w[redacted]d by  03/28/2020, by Last Menstrual Period presenting for routine prenatal visit  Plan    pregnancy Problems (from 08/18/19 to present)    Problem Noted Resolved   Supervision of normal first pregnancy, antepartum 08/18/2019 by Rod Can, CNM No   Overview Addendum 10/11/2019  5:12 PM by Desiree Mood, MD    Clinic [facility] Prenatal Labs  Dating LMP, [place] confirmed 10 wks Blood type: B/Positive/-- (03/15 1521)   Genetic Screen NIPS:declines Antibody:Negative (03/15 1521)  Anatomic [place]  Rubella: 1.19 (03/15 1521) Varicella: Immune  GTT Early: 172 3-hr: 77 / 170 / 154 / 112              28 wk:  RPR: Non Reactive (03/15 1521)   Rhogam  HBsAg: Negative (03/15 1521)   Vaccines TDAP:                       Flu Shot: February 2021 Covid: HIV: Non Reactive (03/15 1521)   Baby Food Breast                               GBS:   Contraception  Pap: 08/02/19 negative  CBB  No   CS/VBAC NA   Support Person Desiree Combs             Gestational age appropriate obstetric precautions including but not limited to vaginal bleeding, contractions, leaking of fluid and fetal movement were reviewed in detail with the patient.    - Telephone Time 7:30 minutes   Return in about 4 weeks (around 11/08/2019) for ROB anatomy scan.  Desiree Mood, MD, [facility], [facility] 10/11/2019, 2:21 PM

## 2019-11-06 ENCOUNTER — Encounter: Payer: Self-pay | Admitting: Obstetrics and Gynecology

## 2019-11-06 ENCOUNTER — Ambulatory Visit (INDEPENDENT_AMBULATORY_CARE_PROVIDER_SITE_OTHER): Payer: BC Managed Care – PPO

## 2019-11-06 ENCOUNTER — Other Ambulatory Visit: Payer: Self-pay | Admitting: Obstetrics and Gynecology

## 2019-11-06 ENCOUNTER — Other Ambulatory Visit: Payer: Self-pay

## 2019-11-06 ENCOUNTER — Ambulatory Visit (INDEPENDENT_AMBULATORY_CARE_PROVIDER_SITE_OTHER): Payer: BC Managed Care – PPO | Admitting: Obstetrics and Gynecology

## 2019-11-06 VITALS — BP 118/74 | Wt 213.0 lb

## 2019-11-06 DIAGNOSIS — Z3A18 18 weeks gestation of pregnancy: Secondary | ICD-10-CM | POA: Diagnosis not present

## 2019-11-06 DIAGNOSIS — O99212 Obesity complicating pregnancy, second trimester: Secondary | ICD-10-CM

## 2019-11-06 DIAGNOSIS — Z3A19 19 weeks gestation of pregnancy: Secondary | ICD-10-CM

## 2019-11-06 DIAGNOSIS — Z363 Encounter for antenatal screening for malformations: Secondary | ICD-10-CM

## 2019-11-06 DIAGNOSIS — Z3686 Encounter for antenatal screening for cervical length: Secondary | ICD-10-CM

## 2019-11-06 DIAGNOSIS — O0992 Supervision of high risk pregnancy, unspecified, second trimester: Secondary | ICD-10-CM

## 2019-11-06 DIAGNOSIS — O99210 Obesity complicating pregnancy, unspecified trimester: Secondary | ICD-10-CM | POA: Diagnosis not present

## 2019-11-06 DIAGNOSIS — Z3400 Encounter for supervision of normal first pregnancy, unspecified trimester: Secondary | ICD-10-CM

## 2019-11-06 NOTE — Progress Notes (Signed)
Routine Prenatal Care Visit  Subjective  Desiree Combs is a 24 y.o. G1P0000 at [redacted]w[redacted]d being seen today for ongoing prenatal care.  She is currently monitored for the following issues for this high-risk pregnancy and has Anxiety; Supervision of high risk pregnancy, antepartum; and Obesity affecting pregnancy, antepartum on their problem list.  ----------------------------------------------------------------------------------- Patient reports no complaints.    . Vag. Bleeding: None.  Movement: Absent. Leaking Fluid denies.  Anatomy u/s incomplete.  ----------------------------------------------------------------------------------- The following portions of the patient's history were reviewed and updated as appropriate: allergies, current medications, past family history, past medical history, past social history, past surgical history and problem list. Problem list updated.  Objective  Blood pressure 118/74, weight 213 lb (96.6 kg), last menstrual period 06/22/2019. Pregravid weight 200 lb (90.7 kg) Total Weight Gain 13 lb (5.897 kg) Urinalysis: Urine Protein    Urine Glucose    Fetal Status: Fetal Heart Rate (bpm): presen   Movement: Absent     General:  Alert, oriented and cooperative. Patient is in no acute distress.  Skin: Skin is warm and dry. No rash noted.   Cardiovascular: Normal heart rate noted  Respiratory: Normal respiratory effort, no problems with respiration noted  Abdomen: Soft, gravid, appropriate for gestational age.       Pelvic:  Cervical exam deferred        Extremities: Normal range of motion.     Mental Status: Normal mood and affect. Normal behavior. Normal judgment and thought content.   Imaging Results US OB Comp + 14 Wk  Result Date: 11/06/2019 Patient Name: Desiree Combs DOB: 1996-06-04 MRN: [id number] ULTRASOUND REPORT Location: [facility] Date of Service: 11/06/2019 Indications:Anatomy Ultrasound Findings: Nelda Marseille intrauterine pregnancy is  visualized with FHR at 141 BPM. Biometrics give an (U/S) Gestational age of [redacted]w[redacted]d and an (U/S) EDD of 04/04/2020; this correlates with the clinically established Estimated Date of Delivery: 03/28/2020 Fetal presentation is Breech. EFW: 253 g (9 oz). Placenta: posterior. Grade: 1 AFI: subjectively normal. Anatomic survey is incomplete for LVOT, 3VV, fetal profile and normal; Gender - surprise.  Impression: 1. [redacted]w[redacted]d Viable Singleton Intrauterine pregnancy. 2. (U/S) EDD is consistent with Clinically established Estimated Date of Delivery: 03/28/20 . 3. Normal Anatomy Scan but incomplete for structures listed above. Recommendations: 1.Clinical correlation with the patient's History and Physical Exam. 2. Repeat ultrasound to complete structures not visualized. Gweneth Dimitri, RT There is a singleton gestation with subjectively normal amniotic fluid volume. The fetal biometry correlates with established dating. Detailed evaluation of the fetal anatomy was performed.The fetal anatomical survey appears within normal limits within the resolution of ultrasound as described above.  Not all structures were able to be visualized on today's study.  It is recommended that a follow-up ultrasound be performed to complete visualization of the unobserved structures today.  It must be noted that a normal ultrasound is unable to rule out fetal aneuploidy nor is it able to detect all possible malformations.   The ultrasound images and findings were reviewed by me and I agree with the above report. Prentice Docker, MD, [facility], [facility] 11/06/2019 5:13 PM     US OB Transvaginal  Result Date: 11/06/2019 Patient Name: Desiree Combs DOB: 11/19/1995 MRN: [id number] ULTRASOUND REPORT Location: [facility] Date of Service: 11/06/2019 Indications:Anatomy Ultrasound Findings: Nelda Marse[place] intrauterine pregnancy is visualized with FHR at 141 BPM.  Biometrics give an (U/S) Gestational age of [redacted]w[redacted]d and an (U/S)  EDD of 04/04/2020; this correlates with the clinically  established Estimated Date of Delivery: 03/28/2020  Fetal presentation is Breech. EFW: 253 g (9 oz). Placenta: posterior. Grade: 1 AFI: subjectively normal.  Anatomic survey is incomplete for LVOT, 3VV, fetal profile and normal; Gender - surprise.   Impression: 1. [redacted]w[redacted]d Viable Singleton Intrauterine pregnancy. 2. (U/S) EDD is consistent with Clinically established Estimated Date of Delivery: 03/28/20 . 3. Normal Anatomy Scan but incomplete for structures listed above.  Recommendations: 1.Clinical correlation with the patient's History and Physical Exam. 2. Repeat ultrasound to complete structures not visualized.  Gweneth Dimitri, RT  There is a singleton gestation with subjectively normal amniotic fluid volume. The fetal biometry correlates with established dating. Detailed evaluation of the fetal anatomy was performed.The fetal anatomical survey appears within normal limits within the resolution of ultrasound as described above.  Not all structures were able to be visualized on today's study.  It is recommended that a follow-up ultrasound be performed to complete visualization of the unobserved structures today.  It must be noted that a normal ultrasound is unable to rule out fetal aneuploidy nor is it able to detect all possible malformations.     The ultrasound images and findings were reviewed by me and I agree with the above report.  Prentice Docker, MD, [facility], [facility] 11/06/2019 5:13 PM       Assessment   24 y.o. G1P0000 at [redacted]w[redacted]d by  03/28/2020, by Last Menstrual Period presenting for routine prenatal visit  Plan   pregnancy Problems (from 08/18/19 to present)    Problem Noted Resolved   Supervision of high risk pregnancy, antepartum 08/18/2019 by Rod Can, CNM No   Overview Addendum 10/11/2019  5:12 PM by [name], MD    Clinic [facility] Prenatal Labs  Dating LMP, [place] confirmed 10 wks Blood  type: B/Positive/-- (03/15 1521)   Genetic Screen NIPS:declines Antibody:Negative (03/15 1521)  Anatomic [place]  Rubella: 1.19 (03/15 1521) Varicella: Immune  GTT Early: 172 3-hr: 77 / 170 / 154 / 112              28 wk:  RPR: Non Reactive (03/15 1521)   Rhogam  HBsAg: Negative (03/15 1521)   Vaccines TDAP:                       Flu Shot: February 2021 Covid: HIV: Non Reactive (03/15 1521)   Baby Food Breast                               GBS:   Contraception  Pap: 08/02/19 negative  CBB  No   CS/VBAC NA   Support Person [name]              Preterm labor symptoms and general obstetric precautions including but not limited to vaginal bleeding, contractions, leaking of fluid and fetal movement were reviewed in detail with the patient. Please refer to After Visit Summary for other counseling recommendations.   Return in about 2 weeks (around 11/20/2019) for U/S for anatomy completion and routine prenatal.  Prentice Docker, MD, [place], [facility] 11/06/2019 5:27 PM

## 2019-11-24 ENCOUNTER — Ambulatory Visit (INDEPENDENT_AMBULATORY_CARE_PROVIDER_SITE_OTHER): Payer: BC Managed Care – PPO | Admitting: Obstetrics

## 2019-11-24 ENCOUNTER — Ambulatory Visit (INDEPENDENT_AMBULATORY_CARE_PROVIDER_SITE_OTHER): Payer: BC Managed Care – PPO

## 2019-11-24 ENCOUNTER — Other Ambulatory Visit: Payer: Self-pay

## 2019-11-24 VITALS — BP 112/58 | Wt 215.0 lb

## 2019-11-24 DIAGNOSIS — O0990 Supervision of high risk pregnancy, unspecified, unspecified trimester: Secondary | ICD-10-CM

## 2019-11-24 DIAGNOSIS — O0992 Supervision of high risk pregnancy, unspecified, second trimester: Secondary | ICD-10-CM

## 2019-11-24 DIAGNOSIS — Z3A22 22 weeks gestation of pregnancy: Secondary | ICD-10-CM

## 2019-11-24 NOTE — Progress Notes (Signed)
ROB  Anatomy scan

## 2019-11-24 NOTE — Progress Notes (Signed)
Routine Prenatal Care Visit  Subjective  Desiree Combs is a 24 y.o. G1P0000 at [redacted]w[redacted]d being seen today for ongoing prenatal care.  She is currently monitored for the following issues for this high-risk pregnancy and has Anxiety; Supervision of high risk pregnancy, antepartum; and Obesity affecting pregnancy, antepartum on their problem list.  ----------------------------------------------------------------------------------- Patient reports no complaints.    .  .   Desiree Combs Fluid denies.  ----------------------------------------------------------------------------------- The following portions of the patient's history were reviewed and updated as appropriate: allergies, current medications, past family history, past medical history, past social history, past surgical history and problem list. Problem list updated.  Objective  Blood pressure (!) 112/58, weight 215 lb (97.5 kg), last menstrual period 06/22/2019. Pregravid weight 200 lb (90.7 kg) Total Weight Gain 15 lb (6.804 kg) Urinalysis: Urine Protein    Urine Glucose    Fetal Status:           General:  Alert, oriented and cooperative. Patient is in no acute distress.  Skin: Skin is warm and dry. No rash noted.   Cardiovascular: Normal heart rate noted  Respiratory: Normal respiratory effort, no problems with respiration noted  Abdomen: Soft, gravid, appropriate for gestational age.       Pelvic:  Cervical exam deferred        Extremities: Normal range of motion.     Mental Status: Normal mood and affect. Normal behavior. Normal judgment and thought content.   Assessment   24 y.o. G1P0000 at [redacted]w[redacted]d by  03/28/2020, by Last Menstrual Period presenting for routine prenatal visit  Plan   pregnancy Problems (from 08/18/19 to present)    Problem Noted Resolved   Supervision of high risk pregnancy, antepartum 08/18/2019 by Rod Can, CNM No   Overview Addendum 11/24/2019  3:36 PM by Imagene Riches, [place]  Prenatal Labs  Dating LMP, [place] confirmed 10 wks Blood type: B/Positive/-- (03/15 1521)   Genetic Screen NIPS:declines Antibody:Negative (03/15 1521)  Anatomic [place]  repeated 11/24/2019 Rubella: 1.19 (03/15 1521) Varicella: Immune  GTT Early: 172 3-hr: 8 / 170 / 154 / 112              28 wk:  RPR: Non Reactive (03/15 1521)   Rhogam  HBsAg: Negative (03/15 1521)   Vaccines TDAP:                       Flu Shot: February 2021 Covid: HIV: Non Reactive (03/15 1521)   Baby Food Breast                               GBS:   Contraception  Pap: 08/02/19 negative  CBB  No   CS/VBAC NA   Support Person [name]              Preterm labor symptoms and general obstetric precautions including but not limited to vaginal bleeding, contractions, leaking of fluid and fetal movement were reviewed in detail with the patient. Please refer to After Visit Summary for other counseling recommendations.  Reviewed her repeat ultrasound today- all normal anatomy.  It's a girl- "Desiree Combs"  Discussed the future glucose testing- she will have a three hour at 28 weeks. Strongy encouraged to take a CBE course, breastfeeding class, or learn from watching videos on the internet/Youtube  Return in about 2 weeks (around 12/08/2019) for return OB.  Imagene Riches, CNM  11/24/2019 4:01 PM

## 2019-11-27 ENCOUNTER — Ambulatory Visit (INDEPENDENT_AMBULATORY_CARE_PROVIDER_SITE_OTHER): Payer: BC Managed Care – PPO | Admitting: Obstetrics and Gynecology

## 2019-11-27 ENCOUNTER — Other Ambulatory Visit: Payer: Self-pay

## 2019-11-27 VITALS — BP 122/74 | Wt 216.0 lb

## 2019-11-27 DIAGNOSIS — O99212 Obesity complicating pregnancy, second trimester: Secondary | ICD-10-CM

## 2019-11-27 DIAGNOSIS — O0992 Supervision of high risk pregnancy, unspecified, second trimester: Secondary | ICD-10-CM

## 2019-11-27 DIAGNOSIS — Z3A22 22 weeks gestation of pregnancy: Secondary | ICD-10-CM

## 2019-11-27 NOTE — Telephone Encounter (Signed)
Patient is schedule for 11/27/19 with SDJ

## 2019-11-27 NOTE — Progress Notes (Signed)
Routine Prenatal Care Visit  Subjective  Desiree Combs is a 24 y.o. G1P0000 at [redacted]w[redacted]d being seen today for ongoing prenatal care.  She is currently monitored for the following issues for this high-risk pregnancy and has Anxiety; Supervision of high risk pregnancy, antepartum; and Obesity affecting pregnancy, antepartum on their problem list.  ----------------------------------------------------------------------------------- Patient reports light brown discharge this morning. She had more fluid-like wetness on her upper thighs/creases of her legs. She has had no gush of fluid.    Contractions: Not present. Vag. Bleeding: None.  Movement: Present. Leaking Fluid denies.  ----------------------------------------------------------------------------------- The following portions of the patient's history were reviewed and updated as appropriate: allergies, current medications, past family history, past medical history, past social history, past surgical history and problem list. Problem list updated.  Objective  Blood pressure 122/74, weight 216 lb (98 kg), last menstrual period 06/22/2019. Pregravid weight 200 lb (90.7 kg) Total Weight Gain 16 lb (7.258 kg) Urinalysis: Urine Protein    Urine Glucose    Fetal Status: Fetal Heart Rate (bpm): 140   Movement: Present     General:  Alert, oriented and cooperative. Patient is in no acute distress.  Skin: Skin is warm and dry. No rash noted.   Cardiovascular: Normal heart rate noted  Respiratory: Normal respiratory effort, no problems with respiration noted  Abdomen: Soft, gravid, appropriate for gestational age. Pain/Pressure: Absent     Pelvic:  Cervical exam performed      no pooling, cervix appears closed. No bleeding present  Extremities: Normal range of motion.  Edema: None  Mental Status: Normal mood and affect. Normal behavior. Normal judgment and thought content.   Wet Prep: PH: 4.0 Clue Cells: Negative Fungal elements:  Negative Trichomonas: Negative  ROM workup: Pooling: negative Ferning: negative Nitrazine: negative  Assessment   24 y.o. G1P0000 at [redacted]w[redacted]d by  03/28/2020, by Last Menstrual Period presenting for work-in prenatal visit  Plan   pregnancy Problems (from 08/18/19 to present)    Problem Noted Resolved   Supervision of high risk pregnancy, antepartum 08/18/2019 by Rod Can, CNM No   Overview Addendum 11/24/2019  4:04 PM by Imagene Riches, [place] Prenatal Labs  Dating LMP, [place] confirmed 10 wks Blood type: B/Positive/-- (03/15 1521)   Genetic Screen NIPS:declines Antibody:Negative (03/15 1521)  Anatomic [place]  repeated 6/4/2021normal female Rubella: 1.19 (03/15 1521) Varicella: Immune  GTT Early: 172 3-hr: 25 / 170 / 154 / 112              28 wk:  RPR: Non Reactive (03/15 1521)   Rhogam  HBsAg: Negative (03/15 1521)   Vaccines TDAP:                       Flu Shot: February 2021 Covid: HIV: Non Reactive (03/15 1521)   Baby Food Breast                               GBS:   Contraception  Pap: 08/02/19 negative  CBB  No   CS/VBAC NA   Support Person [name]              Preterm labor symptoms and general obstetric precautions including but not limited to vaginal bleeding, contractions, leaking of fluid and fetal movement were reviewed in detail with the patient. Please refer to After Visit Summary for other counseling recommendations.   - patient reassured about  ROM today and there is no evidence of infection.  Precautions given for a gush of fluid or other symptoms that could develop that might shed more light on why she had the symptoms she did. Will continue to monitor.  Return for Keep previously scheduled appts.  Prentice Docker, MD, [facility], [facility] 11/28/2019 8:08 AM

## 2019-11-27 NOTE — Telephone Encounter (Signed)
Currently no opening in the schedule/ please advise [place]

## 2019-11-28 ENCOUNTER — Encounter: Payer: Self-pay | Admitting: Obstetrics and Gynecology

## 2019-11-28 ENCOUNTER — Telehealth: Payer: Self-pay

## 2019-11-28 NOTE — Telephone Encounter (Signed)
Called pt back where she had called last night reporting that she tested positive for covid last night, wanted to know if covid would affect the baby, I left a message Per SDJ it is very unlikely to affect the baby. She just needs to make sure she is taking care if her self.

## 2019-12-22 ENCOUNTER — Other Ambulatory Visit: Payer: Self-pay

## 2019-12-22 ENCOUNTER — Ambulatory Visit (INDEPENDENT_AMBULATORY_CARE_PROVIDER_SITE_OTHER): Payer: BC Managed Care – PPO | Admitting: Obstetrics and Gynecology

## 2019-12-22 ENCOUNTER — Encounter: Payer: Self-pay | Admitting: Obstetrics and Gynecology

## 2019-12-22 VITALS — BP 120/78 | Wt 217.0 lb

## 2019-12-22 DIAGNOSIS — Z113 Encounter for screening for infections with a predominantly sexual mode of transmission: Secondary | ICD-10-CM

## 2019-12-22 DIAGNOSIS — O99212 Obesity complicating pregnancy, second trimester: Secondary | ICD-10-CM

## 2019-12-22 DIAGNOSIS — Z131 Encounter for screening for diabetes mellitus: Secondary | ICD-10-CM

## 2019-12-22 DIAGNOSIS — O0992 Supervision of high risk pregnancy, unspecified, second trimester: Secondary | ICD-10-CM

## 2019-12-22 DIAGNOSIS — Z3A26 26 weeks gestation of pregnancy: Secondary | ICD-10-CM

## 2019-12-22 NOTE — Progress Notes (Signed)
  Routine Prenatal Care Visit  Subjective  Desiree Combs is a 24 y.o. G1P0000 at [redacted]w[redacted]d being seen today for ongoing prenatal care.  She is currently monitored for the following issues for this high-risk pregnancy and has Anxiety; Supervision of high risk pregnancy, antepartum; and Obesity affecting pregnancy, antepartum on their problem list.  ----------------------------------------------------------------------------------- Patient reports no complaints.   Contractions: Not present. Vag. Bleeding: None.  Movement: Present. Leaking Fluid denies.  ----------------------------------------------------------------------------------- The following portions of the patient's history were reviewed and updated as appropriate: allergies, current medications, past family history, past medical history, past social history, past surgical history and problem list. Problem list updated.  Objective  Blood pressure 120/78, weight 217 lb (98.4 kg), last menstrual period 06/22/2019. Pregravid weight 200 lb (90.7 kg) Total Weight Gain 17 lb (7.711 kg) Urinalysis: Urine Protein    Urine Glucose    Fetal Status: Fetal Heart Rate (bpm): 145 Fundal Height: 27 cm Movement: Present     General:  Alert, oriented and cooperative. Patient is in no acute distress.  Skin: Skin is warm and dry. No rash noted.   Cardiovascular: Normal heart rate noted  Respiratory: Normal respiratory effort, no problems with respiration noted  Abdomen: Soft, gravid, appropriate for gestational age. Pain/Pressure: Absent     Pelvic:  Cervical exam deferred        Extremities: Normal range of motion.     Mental Status: Normal mood and affect. Normal behavior. Normal judgment and thought content.   Assessment   24 y.o. G1P0000 at [redacted]w[redacted]d by  03/28/2020, by Last Menstrual Period presenting for routine prenatal visit  Plan   pregnancy Problems (from 08/18/19 to present)    Problem Noted Resolved   Supervision of high risk pregnancy,  antepartum 08/18/2019 by Rod Can, CNM No   Overview Addendum 11/24/2019  4:04 PM by Imagene Riches, [place] Prenatal Labs  Dating LMP, [place] confirmed 10 wks Blood type: B/Positive/-- (03/15 1521)   Genetic Screen NIPS:declines Antibody:Negative (03/15 1521)  Anatomic [place]  repeated 6/4/2021normal female Rubella: 1.19 (03/15 1521) Varicella: Immune  GTT Early: 172 3-hr: 38 / 170 / 154 / 112              28 wk:  RPR: Non Reactive (03/15 1521)   Rhogam  HBsAg: Negative (03/15 1521)   Vaccines TDAP:                       Flu Shot: February 2021 Covid: HIV: Non Reactive (03/15 1521)   Baby Food Breast                               GBS:   Contraception  Pap: 08/02/19 negative  CBB  No   CS/VBAC NA   Support Person [name]          Previous Version       Preterm labor symptoms and general obstetric precautions including but not limited to vaginal bleeding, contractions, leaking of fluid and fetal movement were reviewed in detail with the patient. Please refer to After Visit Summary for other counseling recommendations.   Return in about 2 weeks (around 01/05/2020) for 3 hour glucose tolerance test with labs and routine prenatal.  Prentice Docker, MD, [place], [facility] 12/22/2019 8:34 AM

## 2020-01-05 ENCOUNTER — Other Ambulatory Visit: Payer: BC Managed Care – PPO

## 2020-01-05 ENCOUNTER — Encounter: Payer: Self-pay | Admitting: Obstetrics and Gynecology

## 2020-01-05 ENCOUNTER — Ambulatory Visit (INDEPENDENT_AMBULATORY_CARE_PROVIDER_SITE_OTHER): Payer: BC Managed Care – PPO | Admitting: Obstetrics and Gynecology

## 2020-01-05 ENCOUNTER — Other Ambulatory Visit: Payer: Self-pay

## 2020-01-05 VITALS — BP 122/74 | Wt 223.0 lb

## 2020-01-05 DIAGNOSIS — O99213 Obesity complicating pregnancy, third trimester: Secondary | ICD-10-CM

## 2020-01-05 DIAGNOSIS — O0993 Supervision of high risk pregnancy, unspecified, third trimester: Secondary | ICD-10-CM

## 2020-01-05 DIAGNOSIS — Z113 Encounter for screening for infections with a predominantly sexual mode of transmission: Secondary | ICD-10-CM

## 2020-01-05 DIAGNOSIS — L299 Pruritus, unspecified: Secondary | ICD-10-CM

## 2020-01-05 DIAGNOSIS — O0992 Supervision of high risk pregnancy, unspecified, second trimester: Secondary | ICD-10-CM

## 2020-01-05 DIAGNOSIS — O99713 Diseases of the skin and subcutaneous tissue complicating pregnancy, third trimester: Secondary | ICD-10-CM

## 2020-01-05 DIAGNOSIS — Z131 Encounter for screening for diabetes mellitus: Secondary | ICD-10-CM

## 2020-01-05 DIAGNOSIS — Z3A28 28 weeks gestation of pregnancy: Secondary | ICD-10-CM

## 2020-01-05 NOTE — Progress Notes (Signed)
Routine Prenatal Care Visit  Subjective  Desiree Combs is a 24 y.o. G1P0000 at [redacted]w[redacted]d being seen today for ongoing prenatal care.  She is currently monitored for the following issues for this high-risk pregnancy and has Anxiety; Supervision of high risk pregnancy, antepartum; and Obesity affecting pregnancy, antepartum on their problem list.  ----------------------------------------------------------------------------------- Patient reports mild itching on palms and soles. Short lived, requiring no medication..   Contractions: Not present. Vag. Bleeding: None.  Movement: Present. Leaking Fluid denies.  ----------------------------------------------------------------------------------- The following portions of the patient's history were reviewed and updated as appropriate: allergies, current medications, past family history, past medical history, past social history, past surgical history and problem list. Problem list updated.  Objective  Blood pressure 122/74, weight 223 lb (101.2 kg), last menstrual period 06/22/2019. Pregravid weight 200 lb (90.7 kg) Total Weight Gain 23 lb (10.4 kg) Urinalysis: Urine Protein    Urine Glucose    Fetal Status: Fetal Heart Rate (bpm): 145 Fundal Height: 28 cm Movement: Present     General:  Alert, oriented and cooperative. Patient is in no acute distress.  Skin: Skin is warm and dry. No rash noted.   Cardiovascular: Normal heart rate noted  Respiratory: Normal respiratory effort, no problems with respiration noted  Abdomen: Soft, gravid, appropriate for gestational age. Pain/Pressure: Absent     Pelvic:  Cervical exam deferred        Extremities: Normal range of motion.  Edema: None  Mental Status: Normal mood and affect. Normal behavior. Normal judgment and thought content.   Assessment   24 y.o. G1P0000 at [redacted]w[redacted]d by  03/28/2020, by Last Menstrual Period presenting for routine prenatal visit  Plan   pregnancy Problems (from 08/18/19 to present)     Problem Noted Resolved   Supervision of high risk pregnancy, antepartum 08/18/2019 by Rod Can, CNM No   Overview Addendum 11/24/2019  4:04 PM by Imagene Riches, [place] Prenatal Labs  Dating LMP, [place] confirmed 10 wks Blood type: B/Positive/-- (03/15 1521)   Genetic Screen NIPS:declines Antibody:Negative (03/15 1521)  Anatomic [place]  repeated 6/4/2021normal female Rubella: 1.19 (03/15 1521) Varicella: Immune  GTT Early: 172 3-hr: 82 / 170 / 154 / 112              28 wk:  RPR: Non Reactive (03/15 1521)   Rhogam  HBsAg: Negative (03/15 1521)   Vaccines TDAP:                       Flu Shot: February 2021 Covid: HIV: Non Reactive (03/15 1521)   Baby Food Breast                               GBS:   Contraception  Pap: 08/02/19 negative  CBB  No   CS/VBAC NA   Support Person [name]          Previous Version       Preterm labor symptoms and general obstetric precautions including but not limited to vaginal bleeding, contractions, leaking of fluid and fetal movement were reviewed in detail with the patient. Please refer to After Visit Summary for other counseling recommendations.   - 28 week labs today w 3h gtt - total bile acids. Discussed ICP. Will follow for now.  Return in about 2 weeks (around 01/19/2020) for Routine Prenatal Appointment.  Prentice Docker, MD, [facility], [place]  01/05/2020 9:57 AM

## 2020-01-06 LAB — CBC
Hematocrit: 33.7 % — ABNORMAL LOW (ref 34.0–46.6)
Hemoglobin: 11.7 g/dL (ref 11.1–15.9)
MCH: 28.5 pg (ref 26.6–33.0)
MCHC: 34.7 g/dL (ref 31.5–35.7)
MCV: 82 fL (ref 79–97)
Platelets: 230 10*3/uL (ref 150–450)
RBC: 4.1 x10E6/uL (ref 3.77–5.28)
RDW: 12.4 % (ref 11.7–15.4)
WBC: 9.9 10*3/uL (ref 3.4–10.8)

## 2020-01-06 LAB — RPR QUALITATIVE: RPR Ser Ql: NONREACTIVE

## 2020-01-06 LAB — GESTATIONAL GLUCOSE TOLERANCE
Glucose, Fasting: 82 mg/dL (ref 65–94)
Glucose, GTT - 1 Hour: 156 mg/dL (ref 65–179)
Glucose, GTT - 2 Hour: 127 mg/dL (ref 65–154)
Glucose, GTT - 3 Hour: 132 mg/dL (ref 65–139)

## 2020-01-06 LAB — HIV ANTIBODY (ROUTINE TESTING W REFLEX): HIV Screen 4th Generation wRfx: NONREACTIVE

## 2020-01-07 LAB — BILE ACIDS, TOTAL: Bile Acids Total: 2.5 umol/L (ref 0.0–10.0)

## 2020-01-11 ENCOUNTER — Other Ambulatory Visit (HOSPITAL_COMMUNITY)
Admission: RE | Admit: 2020-01-11 | Discharge: 2020-01-11 | Disposition: A | Discharge status: Home / Self Care | Payer: BC Managed Care – PPO | Source: Ambulatory Visit | Attending: Obstetrics and Gynecology | Admitting: Obstetrics and Gynecology

## 2020-01-11 ENCOUNTER — Other Ambulatory Visit: Payer: Self-pay

## 2020-01-11 ENCOUNTER — Ambulatory Visit (INDEPENDENT_AMBULATORY_CARE_PROVIDER_SITE_OTHER): Payer: BC Managed Care – PPO | Admitting: Obstetrics and Gynecology

## 2020-01-11 ENCOUNTER — Encounter: Payer: Self-pay | Admitting: Obstetrics and Gynecology

## 2020-01-11 VITALS — BP 128/81 | Wt 220.0 lb

## 2020-01-11 DIAGNOSIS — O99213 Obesity complicating pregnancy, third trimester: Secondary | ICD-10-CM

## 2020-01-11 DIAGNOSIS — O0993 Supervision of high risk pregnancy, unspecified, third trimester: Secondary | ICD-10-CM | POA: Insufficient documentation

## 2020-01-11 DIAGNOSIS — Z3A29 29 weeks gestation of pregnancy: Secondary | ICD-10-CM

## 2020-01-11 LAB — OB RESULTS CONSOLE GC/CHLAMYDIA: Gonorrhea: NEGATIVE

## 2020-01-11 NOTE — Progress Notes (Signed)
Routine Prenatal Care Visit  Subjective  Desiree Combs is a 24 y.o. G1P0000 at [redacted]w[redacted]d being seen today for ongoing prenatal care.  She is currently monitored for the following issues for this high-risk pregnancy and has Anxiety; Supervision of high risk pregnancy, antepartum; and Obesity affecting pregnancy, antepartum on their problem list.  ----------------------------------------------------------------------------------- Patient reports trickling down her leg when she went from lying down to standing. She still feels wet in her vaginal/perineal area.  She describes the discharge as white. She doesn't think it was mucus. She denies bleeding. She denies a big gush of fluid.  She denies recent intercourse. This has happened before and since this time the discharge was white, she thought she should have it check.     Contractions: Not present.  .  Movement: Present. Leaking Fluid denies.  ----------------------------------------------------------------------------------- The following portions of the patient's history were reviewed and updated as appropriate: allergies, current medications, past family history, past medical history, past social history, past surgical history and problem list. Problem list updated.  Objective  Blood pressure 128/81, weight (!) 220 lb (99.8 kg), last menstrual period 06/22/2019. Pregravid weight 200 lb (90.7 kg) Total Weight Gain 20 lb (9.072 kg) Urinalysis: Urine Protein    Urine Glucose    Fetal Status:     Movement: Present     General:  Alert, oriented and cooperative. Patient is in no acute distress.  Skin: Skin is warm and dry. No rash noted.   Cardiovascular: Normal heart rate noted  Respiratory: Normal respiratory effort, no problems with respiration noted  Abdomen: Soft, gravid, appropriate for gestational age. Pain/Pressure: Absent     Pelvic:  Cervical exam deferred        Extremities: Normal range of motion.     Mental Status: Normal mood and  affect. Normal behavior. Normal judgment and thought content.   ROM workup: Pooling: negative Nitrazine: negative Ferning: negative  Wet Prep: PH: 4.0 Clue Cells: Negative Fungal elements: Negative Trichomonas: Negative   Assessment   24 y.o. G1P0000 at [redacted]w[redacted]d by  03/28/2020, by Last Menstrual Period presenting for work-in prenatal visit  Plan   pregnancy Problems (from 08/18/19 to present)    Problem Noted Resolved   Supervision of high risk pregnancy, antepartum 08/18/2019 by Rod Can, CNM No   Overview Addendum 11/24/2019  4:04 PM by Imagene Riches, [place] Prenatal Labs  Dating LMP, [place] confirmed 10 wks Blood type: B/Positive/-- (03/15 1521)   Genetic Screen NIPS:declines Antibody:Negative (03/15 1521)  Anatomic [place]  repeated 6/4/2021normal female Rubella: 1.19 (03/15 1521) Varicella: Immune  GTT Early: 172 3-hr: 77 / 170 / 154 / 112              28 wk:  RPR: Non Reactive (03/15 1521)   Rhogam  HBsAg: Negative (03/15 1521)   Vaccines TDAP:                       Flu Shot: February 2021 Covid: HIV: Non Reactive (03/15 1521)   Baby Food Breast                               GBS:   Contraception  Pap: 08/02/19 negative  CBB  No   CS/VBAC NA   Support Person [name]          Previous Version       Preterm labor symptoms and general  obstetric precautions including but not limited to vaginal bleeding, contractions, leaking of fluid and fetal movement were reviewed in detail with the patient. Please refer to After Visit Summary for other counseling recommendations.   No evidence of ROM or infection Will send aptima  Keep previous follow up scheduled   Prentice Docker, MD, [place], [facility] 01/11/2020 11:41 AM

## 2020-01-11 NOTE — Addendum Note (Signed)
Addended by: Prentice Docker D on: 01/11/2020 12:21 PM   Modules accepted: Orders

## 2020-01-12 LAB — CERVICOVAGINAL ANCILLARY ONLY
Chlamydia: NEGATIVE
Comment: NEGATIVE
Comment: NEGATIVE
Comment: NORMAL
Neisseria Gonorrhea: NEGATIVE
Trichomonas: NEGATIVE

## 2020-01-19 ENCOUNTER — Ambulatory Visit (INDEPENDENT_AMBULATORY_CARE_PROVIDER_SITE_OTHER): Payer: BC Managed Care – PPO | Admitting: Obstetrics

## 2020-01-19 ENCOUNTER — Other Ambulatory Visit: Payer: Self-pay

## 2020-01-19 VITALS — BP 110/70 | Wt 223.0 lb

## 2020-01-19 DIAGNOSIS — Z3A30 30 weeks gestation of pregnancy: Secondary | ICD-10-CM

## 2020-01-19 DIAGNOSIS — Z23 Encounter for immunization: Secondary | ICD-10-CM

## 2020-01-19 LAB — POCT URINALYSIS DIPSTICK OB
Glucose, UA: NEGATIVE
POC,PROTEIN,UA: NEGATIVE

## 2020-01-19 NOTE — Progress Notes (Signed)
ROB/TDAP and BT consent today- no concerns

## 2020-01-19 NOTE — Progress Notes (Signed)
  Routine Prenatal Care Visit  Subjective  Desiree Combs is a 24 y.o. G1P0000 at [redacted]w[redacted]d being seen today for ongoing prenatal care.  She is currently monitored for the following issues for this high-risk pregnancy and has Anxiety; Supervision of high risk pregnancy, antepartum; and Obesity affecting pregnancy, antepartum on their problem list.  ----------------------------------------------------------------------------------- Patient reports no complaints.    .  .   Desiree Combs Fluid denies.  ----------------------------------------------------------------------------------- The following portions of the patient's history were reviewed and updated as appropriate: allergies, current medications, past family history, past medical history, past social history, past surgical history and problem list. Problem list updated.  Objective  Blood pressure 110/70, weight (!) 223 lb (101.2 kg), last menstrual period 06/22/2019. Pregravid weight 200 lb (90.7 kg) Total Weight Gain 23 lb (10.4 kg) Urinalysis: Urine Protein Negative  Urine Glucose Negative  Fetal Status:           General:  Alert, oriented and cooperative. Patient is in no acute distress.  Skin: Skin is warm and dry. No rash noted.   Cardiovascular: Normal heart rate noted  Respiratory: Normal respiratory effort, no problems with respiration noted  Abdomen: Soft, gravid, appropriate for gestational age.       Pelvic:  Cervical exam deferred        Extremities: Normal range of motion.     Mental Status: Normal mood and affect. Normal behavior. Normal judgment and thought content.   Assessment   24 y.o. G1P0000 at [redacted]w[redacted]d by  03/28/2020, by Last Menstrual Period presenting for routine prenatal visit  Plan   pregnancy Problems (from 08/18/19 to present)    Problem Noted Resolved   Supervision of high risk pregnancy, antepartum 08/18/2019 by Rod Can, CNM No   Overview Addendum 11/24/2019  4:04 PM by Imagene Riches, [place] Prenatal Labs  Dating LMP, [place] confirmed 10 wks Blood type: B/Positive/-- (03/15 1521)   Genetic Screen NIPS:declines Antibody:Negative (03/15 1521)  Anatomic [place]  repeated 6/4/2021normal female Rubella: 1.19 (03/15 1521) Varicella: Immune  GTT Early: 172 3-hr: 48 / 170 / 154 / 112              28 wk:  RPR: Non Reactive (03/15 1521)   Rhogam  HBsAg: Negative (03/15 1521)   Vaccines TDAP:                       Flu Shot: February 2021 Covid: HIV: Non Reactive (03/15 1521)   Baby Food Breast                               GBS:   Contraception  Pap: 08/02/19 negative  CBB  No   CS/VBAC NA   Support Person [name]          Previous Version       Preterm labor symptoms and general obstetric precautions including but not limited to vaginal bleeding, contractions, leaking of fluid and fetal movement were reviewed in detail with the patient. Please refer to After Visit Summary for other counseling recommendations.   Return in about 2 weeks (around 02/02/2020) for return OB.  Imagene Riches, CNM  01/19/2020 3:43 PM

## 2020-02-02 ENCOUNTER — Other Ambulatory Visit: Payer: Self-pay

## 2020-02-02 ENCOUNTER — Ambulatory Visit (INDEPENDENT_AMBULATORY_CARE_PROVIDER_SITE_OTHER): Payer: BC Managed Care – PPO | Admitting: Obstetrics

## 2020-02-02 VITALS — BP 120/80 | Wt 225.0 lb

## 2020-02-02 DIAGNOSIS — Z3A32 32 weeks gestation of pregnancy: Secondary | ICD-10-CM

## 2020-02-02 DIAGNOSIS — O0993 Supervision of high risk pregnancy, unspecified, third trimester: Secondary | ICD-10-CM

## 2020-02-02 DIAGNOSIS — O0990 Supervision of high risk pregnancy, unspecified, unspecified trimester: Secondary | ICD-10-CM

## 2020-02-02 LAB — POCT URINALYSIS DIPSTICK OB
Glucose, UA: NEGATIVE
POC,PROTEIN,UA: NEGATIVE

## 2020-02-02 NOTE — Progress Notes (Signed)
  Routine Prenatal Care Visit  Subjective  Desiree Combs is a 24 y.o. G1P0000 at [redacted]w[redacted]d being seen today for ongoing prenatal care.  She is currently monitored for the following issues for this high-risk pregnancy and has Anxiety; Supervision of high risk pregnancy, antepartum; and Obesity affecting pregnancy, antepartum on their problem list.  ----------------------------------------------------------------------------------- Patient reports no complaints.   Contractions: Not present. Vag. Bleeding: None.  Movement: Present. Leaking Fluid denies.  ----------------------------------------------------------------------------------- The following portions of the patient's history were reviewed and updated as appropriate: allergies, current medications, past family history, past medical history, past social history, past surgical history and problem list. Problem list updated.  Objective  Blood pressure 120/80, weight 225 lb (102.1 kg), last menstrual period 06/22/2019. Pregravid weight 200 lb (90.7 kg) Total Weight Gain 25 lb (11.3 kg) Urinalysis: Urine Protein    Urine Glucose    Fetal Status:     Movement: Present     General:  Alert, oriented and cooperative. Patient is in no acute distress.  Skin: Skin is warm and dry. No rash noted.   Cardiovascular: Normal heart rate noted  Respiratory: Normal respiratory effort, no problems with respiration noted  Abdomen: Soft, gravid, appropriate for gestational age. Pain/Pressure: Absent     Pelvic:  Cervical exam deferred        Extremities: Normal range of motion.     Mental Status: Normal mood and affect. Normal behavior. Normal judgment and thought content.   Assessment   24 y.o. G1P0000 at [redacted]w[redacted]d by  03/28/2020, by Last Menstrual Period presenting for routine prenatal visit  Plan   pregnancy Problems (from 08/18/19 to present)    Problem Noted Resolved   Supervision of high risk pregnancy, antepartum 08/18/2019 by Rod Can, CNM No     Overview Addendum 02/02/2020 11:00 AM by Imagene Riches, [place] Prenatal Labs  Dating LMP, [place] confirmed 10 wks Blood type: B/Positive/-- (03/15 1521)   Genetic Screen NIPS:declines Antibody:Negative (03/15 1521)  Anatomic [place]  repeated 6/4/2021normal female Rubella: 1.19 (03/15 1521) Varicella: Immune  GTT Early: 172 3-hr: 77 / 170 / 154 / 112              28 wk: WNL RPR: Non Reactive (03/15 1521)   Rhogam  HBsAg: Negative (03/15 1521)   Vaccines TDAP:                       Flu Shot: February 2021 Covid: HIV: Non Reactive (03/15 1521)   Baby Food Breast                               GBS:   Contraception  Pap: 08/02/19 negative  CBB  No   CS/VBAC NA   Support Person [name]          Previous Version       Preterm labor symptoms and general obstetric precautions including but not limited to vaginal bleeding, contractions, leaking of fluid and fetal movement were reviewed in detail with the patient. Please refer to After Visit Summary for other counseling recommendations.   Return in about 2 weeks (around 02/16/2020) for return OB.  Imagene Riches, CNM  02/02/2020 11:02 AM

## 2020-02-16 ENCOUNTER — Ambulatory Visit (INDEPENDENT_AMBULATORY_CARE_PROVIDER_SITE_OTHER): Payer: BC Managed Care – PPO | Admitting: Obstetrics

## 2020-02-16 ENCOUNTER — Other Ambulatory Visit: Payer: Self-pay

## 2020-02-16 VITALS — BP 140/80 | Wt 231.0 lb

## 2020-02-16 DIAGNOSIS — O0993 Supervision of high risk pregnancy, unspecified, third trimester: Secondary | ICD-10-CM

## 2020-02-16 DIAGNOSIS — O0990 Supervision of high risk pregnancy, unspecified, unspecified trimester: Secondary | ICD-10-CM

## 2020-02-16 DIAGNOSIS — O99213 Obesity complicating pregnancy, third trimester: Secondary | ICD-10-CM

## 2020-02-16 DIAGNOSIS — Z3A34 34 weeks gestation of pregnancy: Secondary | ICD-10-CM

## 2020-02-16 DIAGNOSIS — O99210 Obesity complicating pregnancy, unspecified trimester: Secondary | ICD-10-CM

## 2020-02-16 NOTE — Progress Notes (Signed)
Co acid reflux is really bad - adv elevate the head of the bed, avoid spicy/fried foods, eat smaller meals, lie on right side as stomach empties out on the right; nasal congestion - adv saline nasal spray, benadryl briefly.rj

## 2020-02-16 NOTE — Progress Notes (Signed)
Routine Prenatal Care Visit  Subjective  Desiree Combs is a 24 y.o. G1P0000 at [redacted]w[redacted]d being seen today for ongoing prenatal care.  She is currently monitored for the following issues for this low-risk pregnancy and has Anxiety; Supervision of high risk pregnancy, antepartum; and Obesity affecting pregnancy, antepartum on their problem list.  ----------------------------------------------------------------------------------- Patient reports heartburn, no bleeding, no contractions and no cramping.   Contractions: Not present. Vag. Bleeding: None.  Movement: Present. Leaking Fluid denies.  ----------------------------------------------------------------------------------- The following portions of the patient's history were reviewed and updated as appropriate: allergies, current medications, past family history, past medical history, past social history, past surgical history and problem list. Problem list updated.  Objective  Blood pressure 140/80, weight 231 lb (104.8 kg), last menstrual period 06/22/2019. BP recheck 138/88 Pregravid weight 200 lb (90.7 kg) Total Weight Gain 31 lb (14.1 kg) Urinalysis: Urine Protein    Urine Glucose    Fetal Status: Fetal Heart Rate (bpm): 149 Fundal Height: 35 cm Movement: Present     General:  Alert, oriented and cooperative. Patient is in no acute distress.  Skin: Skin is warm and dry. No rash noted.   Cardiovascular: Normal heart rate noted  Respiratory: Normal respiratory effort, no problems with respiration noted  Abdomen: Soft, gravid, appropriate for gestational age. Pain/Pressure: Absent     Pelvic:  Cervical exam deferred        Extremities: Normal range of motion.  Edema: Trace  Mental Status: Normal mood and affect. Normal behavior. Normal judgment and thought content.   Assessment   24 y.o. G1P0000 at [redacted]w[redacted]d by  03/28/2020, by Last Menstrual Period presenting for routine prenatal visit Initial BP is 086 systolic. Rechecked and WNL  Plan     pregnancy Problems (from 08/18/19 to present)    Problem Noted Resolved   Supervision of high risk pregnancy, antepartum 08/18/2019 by Rod Can, CNM No   Overview Addendum 02/16/2020  4:48 PM by Imagene Riches, [place] Prenatal Labs  Dating LMP, [place] confirmed 10 wks Blood type: B/Positive/-- (03/15 1521)   Genetic Screen NIPS:declines Antibody:Negative (03/15 1521)  Anatomic [place]  repeated 6/4/2021normal female Rubella: 1.19 (03/15 1521) Varicella: Immune  GTT Early: 172 3-hr: 77 / 170 / 154 / 112              28 wk: WNL RPR: Non Reactive (03/15 1521)   Rhogam  HBsAg: Negative (03/15 1521)   Vaccines TDAP:         7/30              Flu Shot: February 2021 Covid: HIV: Non Reactive (03/15 1521)   Baby Food Breast                               GBS:   Contraception  Pap: 08/02/19 negative  CBB  No   CS/VBAC NA   Support Person [name]          Previous Version       Preterm labor symptoms and general obstetric precautions including but not limited to vaginal bleeding, contractions, leaking of fluid and fetal movement were reviewed in detail with the patient. Please refer to After Visit Summary for other counseling recommendations.  Discussed her acid reflux and suggestions made, including zantac daily Will have her return in one week for a BP checkSXS of HTN reviewed carefully. Growth scan ordered for next visit.  Return  in about 2 weeks (around 03/01/2020) for return OB GBS.  Imagene Riches, CNM  02/16/2020 5:30 PM

## 2020-02-23 ENCOUNTER — Ambulatory Visit (INDEPENDENT_AMBULATORY_CARE_PROVIDER_SITE_OTHER): Payer: BC Managed Care – PPO | Admitting: Obstetrics

## 2020-02-23 ENCOUNTER — Other Ambulatory Visit: Payer: Self-pay

## 2020-02-23 VITALS — BP 130/80 | Wt 229.0 lb

## 2020-02-23 DIAGNOSIS — Z3A35 35 weeks gestation of pregnancy: Secondary | ICD-10-CM | POA: Diagnosis not present

## 2020-02-23 DIAGNOSIS — Z23 Encounter for immunization: Secondary | ICD-10-CM

## 2020-02-23 DIAGNOSIS — O0993 Supervision of high risk pregnancy, unspecified, third trimester: Secondary | ICD-10-CM

## 2020-02-23 LAB — POCT URINALYSIS DIPSTICK OB
Glucose, UA: NEGATIVE
POC,PROTEIN,UA: NEGATIVE

## 2020-02-23 NOTE — Progress Notes (Signed)
Routine Prenatal Care Visit  Subjective  Desiree Combs is a 24 y.o. G1P0000 at [redacted]w[redacted]d being seen today for ongoing prenatal care.  She is currently monitored for the following issues for this high-risk pregnancy and has Anxiety; Supervision of high risk pregnancy, antepartum; and Obesity affecting pregnancy, antepartum on their problem list.  ----------------------------------------------------------------------------------- Patient reports no bleeding, no contractions, no cramping and no leaking.  She has noticed increased white , non itchy  Discharge.  Also had some itching in her feet yesterday. Last week had a baby shower and is stocked up for her newborn.  .  .   [name] Fluid denies.  ----------------------------------------------------------------------------------- The following portions of the patient's history were reviewed and updated as appropriate: allergies, current medications, past family history, past medical history, past social history, past surgical history and problem list. Problem list updated.  Objective  Blood pressure 130/80, weight 229 lb (103.9 kg), last menstrual period 06/22/2019. Pregravid weight 200 lb (90.7 kg) Total Weight Gain 29 lb (13.2 kg) Urinalysis: Urine Protein    Urine Glucose    Fetal Status:           General:  Alert, oriented and cooperative. Patient is in no acute distress.  Skin: Skin is warm and dry. No rash noted.   Cardiovascular: Normal heart rate noted  Respiratory: Normal respiratory effort, no problems with respiration noted  Abdomen: Soft, gravid, appropriate for gestational age.       Pelvic:  Cervical exam deferred        Extremities: Normal range of motion.     Mental Status: Normal mood and affect. Normal behavior. Normal judgment and thought content.   Assessment   24 y.o. G1P0000 at 110w1d by  03/28/2020, by Last Menstrual Period presenting for routine prenatal visit  Plan   pregnancy Problems (from 08/18/19 to present)     Problem Noted Resolved   Supervision of high risk pregnancy, antepartum 08/18/2019 by Rod Can, CNM No   Overview Addendum 02/16/2020  4:48 PM by Imagene Riches, [place] Prenatal Labs  Dating LMP, [place] confirmed 10 wks Blood type: B/Positive/-- (03/15 1521)   Genetic Screen NIPS:declines Antibody:Negative (03/15 1521)  Anatomic [place]  repeated 6/4/2021normal female Rubella: 1.19 (03/15 1521) Varicella: Immune  GTT Early: 172 3-hr: 77 / 170 / 154 / 112              28 wk: WNL RPR: Non Reactive (03/15 1521)   Rhogam  HBsAg: Negative (03/15 1521)   Vaccines TDAP:         7/30              Flu Shot: February 2021 Covid: HIV: Non Reactive (03/15 1521)   Baby Food Breast                               GBS:   Contraception  Pap: 08/02/19 negative  CBB  No   CS/VBAC NA   Support Person [name]          Previous Version       Term labor symptoms and general obstetric precautions including but not limited to vaginal bleeding, contractions, leaking of fluid and fetal movement were reviewed in detail with the patient. Please refer to After Visit Summary for other counseling recommendations.  She is due for a growth scan. Set up for next visit. Suspect the dc is leukkorhea- may use OTC  Monistat if any itching occurs.  Return in about 1 week (around 03/01/2020) for return OB adn GBS, cultures.  Imagene Riches, CNM  02/23/2020 8:53 AM

## 2020-02-23 NOTE — Progress Notes (Signed)
pocROB- flu shot today, no concerns

## 2020-02-26 ENCOUNTER — Observation Stay
Admission: EM | Admit: 2020-02-26 | Discharge: 2020-02-26 | Disposition: A | Discharge status: Home / Self Care | Payer: BC Managed Care – PPO | Attending: Obstetrics & Gynecology | Admitting: Obstetrics & Gynecology

## 2020-02-26 ENCOUNTER — Other Ambulatory Visit: Payer: Self-pay

## 2020-02-26 ENCOUNTER — Encounter: Payer: Self-pay | Admitting: Obstetrics & Gynecology

## 2020-02-26 DIAGNOSIS — Z3A35 35 weeks gestation of pregnancy: Secondary | ICD-10-CM

## 2020-02-26 DIAGNOSIS — H538 Other visual disturbances: Secondary | ICD-10-CM | POA: Diagnosis present

## 2020-02-26 DIAGNOSIS — O26893 Other specified pregnancy related conditions, third trimester: Principal | ICD-10-CM | POA: Insufficient documentation

## 2020-02-26 DIAGNOSIS — O0990 Supervision of high risk pregnancy, unspecified, unspecified trimester: Secondary | ICD-10-CM

## 2020-02-26 DIAGNOSIS — O99891 Other specified diseases and conditions complicating pregnancy: Secondary | ICD-10-CM

## 2020-02-26 DIAGNOSIS — O0993 Supervision of high risk pregnancy, unspecified, third trimester: Secondary | ICD-10-CM | POA: Insufficient documentation

## 2020-02-26 LAB — PROTEIN / CREATININE RATIO, URINE
Creatinine, Urine: 39 mg/dL
Protein Creatinine Ratio: 0.18 mg/mg{Cre} — ABNORMAL HIGH (ref 0.00–0.15)
Total Protein, Urine: 7 mg/dL

## 2020-02-26 MED ORDER — ONDANSETRON HCL 4 MG/2ML IJ SOLN: 4.0000 mg | Freq: Four times a day (QID) | INTRAMUSCULAR | Status: DC | PRN

## 2020-02-26 MED ORDER — ACETAMINOPHEN 325 MG PO TABS: 650.0000 mg | ORAL_TABLET | ORAL | Status: DC | PRN

## 2020-02-26 MED ORDER — LIDOCAINE HCL (PF) 1 % IJ SOLN: 30.0000 mL | INTRAMUSCULAR | Status: DC | PRN

## 2020-02-26 NOTE — Discharge Summary (Signed)
See FPN

## 2020-02-26 NOTE — OB Triage Note (Signed)
Pt. presented to L/D with reported blurry vision that began at 1130 while tidying the house. She reports "blurry lines/heat waves" with occasional floaters. She reports no contractions, leaking of fluid, or bleeding. Pt. reports positive fetal movement. Pt reports no headache, epigastric pain, or unusual swelling. +2 reflexes, no clonus noted. Last BP 137/82 with pressures cycling q8min. Monitors applied and assessing.

## 2020-02-26 NOTE — Final Progress Note (Signed)
Physician Final Progress Note  Patient ID: Desiree Combs MRN: [id number] DOB/AGE: 1995-10-02 24 y.o.  Admit date: 02/26/2020 Admitting provider: Gae Dry, MD Discharge date: 02/26/2020  Admission Diagnoses:    Blurry vision   [redacted] weeks gestation of pregnancy  Discharge Diagnoses:  Active Problems:   Blurry vision   [redacted] weeks gestation of pregnancy   Consults: None  Significant Findings/ Diagnostic Studies:  Obstetrics Admission History & Physical   CC: Blurred Vision   HPI:  24 y.o. G1P0000 @ [redacted]w[redacted]d (03/28/2020, by Last Menstrual Period). Admitted on 02/26/2020:   Patient Active Problem List   Diagnosis Date Noted  . Blurry vision 02/26/2020  . [redacted] weeks gestation of pregnancy   . Supervision of high risk pregnancy, antepartum 08/18/2019  . Obesity affecting pregnancy, antepartum 08/18/2019  . Anxiety      Presents for c/o blurry vision today, was at home with no recent trauma, infection, exertional activity. Denies ha, CP, SOB, epig pain, or worsening edema (has had edema for a while).  Was seen in office w borderline elevated blood pressures (130/80s).  Pregnancy otherwise going well.   Prenatal care at: at [facility]. Pregnancy complicated by none.  ROS: A review of systems was performed and negative, except as stated in the above HPI. PMHx:  Past Medical History:  Diagnosis Date  . Anxiety    PSHx:  Past Surgical History:  Procedure Laterality Date  . OPEN REDUCTION INTERNAL FIXATION (ORIF) DISTAL RADIAL FRACTURE Left 03/03/2019   Procedure: OPEN REDUCTION INTERNAL FIXATION (ORIF) DISTAL RADIAL FRACTURE;  Surgeon: Hessie Knows, MD;  Location: [facility] ORS;  Service: Orthopedics;  Laterality: Left;   Medications:  Medications Prior to Admission  Medication Sig Dispense Refill Last Dose  . Prenatal Vit-DSS-Fe Fum-FA (PRENATAL 19) tablet Take 1 tablet by mouth daily.   02/26/2020 at Unknown time   Allergies: has No Known Allergies. OBHx:  OB History  Gravida Para  Term Preterm AB Living  1 0 0 0 0 0  SAB TAB Ectopic Multiple Live Births  0 0 0 0 0    # Outcome Date GA Lbr Len/2nd Weight Sex Delivery Anes PTL Lv  1 Current            CXK:GYJEHUDJ/SHFWYOVZCHYI except as detailed in HPI.Marland Kitchen  No family history of birth defects. Soc Hx: Alcohol: none and Recreational drug use: none  Objective:   Vitals:   02/26/20 1401 02/26/20 1416  BP: 125/79 123/73  Pulse: 100 92  Resp:    Temp:     Constitutional: Well nourished, well developed female in no acute distress.  HEENT: normal Skin: Warm and dry.  Cardiovascular:Regular rate and rhythm.   Extremity: trace to 1+ bilateral pedal edema Respiratory: Clear to auscultation bilateral. Normal respiratory effort Abdomen: gravid, ND, FHT present, without guarding, without rebound tenderness on exam Back: no CVAT Neuro: DTRs 2+, Cranial nerves grossly intact Psych: Alert and Oriented x3. No memory deficits. Normal mood and affect.  MS: normal gait, normal bilateral lower extremity ROM/strength/stability.    Assessment & Plan:   24 y.o. G1P0000 @ [redacted]w[redacted]d, Admitted on 02/26/2020:B;urry Vision No s/sx HTN PIH or preeclampsia Monitor continued sx's at home, hydrate, rest    Procedures: A NST procedure was performed with FHR monitoring and a normal baseline established, appropriate time of 20-40 minutes of evaluation, and accels >2 seen w 15x15 characteristics.  Results show a REACTIVE NST.   Discharge Condition: good  Disposition: Discharge disposition: 01-Home or Self Care  Diet: Regular diet  Discharge Activity: Activity as tolerated  Discharge Instructions    Call MD for:   Complete by: As directed    Worsening contractions or pain; leakage of fluid; bleeding.   Diet - low sodium heart healthy   Complete by: As directed    Increase activity slowly   Complete by: As directed      Allergies as of 02/26/2020   No Known Allergies     Medication List    TAKE these medications    Prenatal 19 tablet Take 1 tablet by mouth daily.       [place] Follow up.   Why: As scheduled Contact information: [address] [place] [address] [phone]               Total time spent taking care of this patient: 20 minutes  Signed: Hoyt Koch 02/26/2020, 2:21 PM

## 2020-02-26 NOTE — Discharge Summary (Signed)
RN reviewed discharge instructions with patient. Gave patient opportunity for questions. All questions answered at this time. Pt discharged home with her significant other.

## 2020-02-27 NOTE — Telephone Encounter (Signed)
Please advise 

## 2020-02-28 ENCOUNTER — Ambulatory Visit (INDEPENDENT_AMBULATORY_CARE_PROVIDER_SITE_OTHER): Payer: BC Managed Care – PPO

## 2020-02-28 ENCOUNTER — Other Ambulatory Visit: Payer: Self-pay

## 2020-02-28 ENCOUNTER — Other Ambulatory Visit: Payer: Self-pay | Admitting: Obstetrics and Gynecology

## 2020-02-28 ENCOUNTER — Telehealth: Payer: Self-pay | Admitting: Advanced Practice Midwife

## 2020-02-28 ENCOUNTER — Encounter: Payer: Self-pay | Admitting: Advanced Practice Midwife

## 2020-02-28 ENCOUNTER — Ambulatory Visit (INDEPENDENT_AMBULATORY_CARE_PROVIDER_SITE_OTHER): Payer: BC Managed Care – PPO | Admitting: Advanced Practice Midwife

## 2020-02-28 VITALS — BP 128/74 | Wt 228.6 lb

## 2020-02-28 DIAGNOSIS — O365930 Maternal care for other known or suspected poor fetal growth, third trimester, not applicable or unspecified: Secondary | ICD-10-CM

## 2020-02-28 DIAGNOSIS — Z3A34 34 weeks gestation of pregnancy: Secondary | ICD-10-CM

## 2020-02-28 DIAGNOSIS — O0990 Supervision of high risk pregnancy, unspecified, unspecified trimester: Secondary | ICD-10-CM

## 2020-02-28 DIAGNOSIS — Z3A35 35 weeks gestation of pregnancy: Secondary | ICD-10-CM | POA: Diagnosis not present

## 2020-02-28 DIAGNOSIS — O99210 Obesity complicating pregnancy, unspecified trimester: Secondary | ICD-10-CM

## 2020-02-28 DIAGNOSIS — O99213 Obesity complicating pregnancy, third trimester: Secondary | ICD-10-CM

## 2020-02-28 DIAGNOSIS — O0993 Supervision of high risk pregnancy, unspecified, third trimester: Secondary | ICD-10-CM | POA: Diagnosis not present

## 2020-02-28 LAB — POCT URINALYSIS DIPSTICK OB
Glucose, UA: NEGATIVE
POC,PROTEIN,UA: NEGATIVE

## 2020-02-28 NOTE — Telephone Encounter (Signed)
Patient is calling to get a note to leave work earlier than do date. Patient wants to know if the note and saying patient can work half day starting next week in needed. Starting September 20th being the last day of work. Please advise. Patient would like call when she can pick up letter.

## 2020-02-28 NOTE — Patient Instructions (Signed)
Intrauterine Growth Restriction  Intrauterine growth restriction (IUGR) is when a baby is not growing normally during pregnancy. A baby with IUGR is smaller than it should be and may weigh less than normal at birth. IUGR can result from a problem with the organ that supplies the unborn baby (fetus) with oxygen and nutrition (placenta). Usually, there is no way to prevent this type of problem. Babies with IUGR are at higher risk for early delivery and needing special (intensive) care after birth. What are the causes? The most common cause of IUGR is a problem with the placenta or umbilical cord that causes the fetus to get less oxygen or nutrition than needed. Other causes include:  The mother eating a very unhealthy diet (poor maternal nutrition).  Exposure to chemicals found in substances such as cigarettes, alcohol, and some drugs.  Some prescription medicines.  Other problems that develop in the womb (congenital birth defects).  Genetic disorders.  Infection.  Carrying more than one baby. What increases the risk? This condition is more likely to affect babies of mothers who:  Are over the age of 75 at the time of delivery.  Are younger than age 67 at the time of delivery.  Have medical conditions such as high blood pressure, preeclampsia, diabetes, heart or kidney disease, systemic lupus erythematosus, or anemia.  Live at a very high altitude during pregnancy.  Have a personal history or family history of: ? IUGR. ? A genetic disorder.  Take medicines during pregnancy that are related to congenital disabilities.  Come into contact with infected cat feces (toxoplasmosis).  Come into contact with chickenpox (varicella) or [place] measles (rubella).  Have or are at risk of getting an infectious disease such as syphilis, HIV, or herpes.  Eat an unhealthy diet during pregnancy.  Weigh less than 100 pounds.  Have had treatments to help her have children (infertility  treatments).  Use tobacco, drugs, or alcohol during pregnancy. What are the signs or symptoms? IUGR does not cause many symptoms. You might notice that your baby does not move or kick very often. Also, your belly may not be as big as expected for the stage of your pregnancy. How is this diagnosed? This condition is diagnosed with physical exams and prenatal exams. You may also have:  Fundal measurements to check the size of your uterus.  An ultrasound done to measure your baby's size compared to the size of other babies at the same stage of development (gestational age). Your health care provider will monitor your baby's growth with ultrasounds throughout pregnancy. You may also have tests to find the cause of IUGR. These may include:  Amniocentesis. This is a procedure that involves passing a needle into the uterus to collect a sample of fluid that surrounds the fetus (amniotic fluid). This may be done to check for signs of infection or congenital defects.  Tests to evaluate blood flow to your baby and placenta. How is this treated? In most cases, the goal of treatment is to treat the cause of IUGR. Your health care providers will monitor your pregnancy closely and help you manage your pregnancy. If your condition is caused by a placenta problem and your baby is not getting enough blood, you may need:  Medicine to start labor and deliver your baby early (induction).  Cesarean delivery, also called a C-section. In this procedure, your baby is delivered through an incision in your abdomen and uterus. Follow these instructions at home: Medicines  Take over-the-counter and prescription medicines  only as told by your health care provider. This includes vitamins and supplements.  Make sure that your health care provider knows about and approves of all the medicines, supplements, vitamins, eye drops, and creams that you use. General instructions  Eat a healthy diet that includes fresh fruits  and vegetables, lean proteins, whole grains, and calcium-rich foods such as milk, yogurt, and dark, leafy greens. Work with your health care provider or a dietitian to make sure that: ? You are getting enough nutrients. ? You are gaining enough weight.  Rest as needed. Try to get at least 8 hours of sleep every night.  Do not drink alcohol or use drugs.  Do not use any products that contain nicotine or tobacco, such as cigarettes and e-cigarettes. If you need help quitting, ask your health care provider.  Keep all follow-up visits as told by your health care provider. This is important. Get help right away if you:  Notice that your baby is moving less than usual or is not moving.  Have contractions that are 5 minutes or less apart, or that increase in frequency, intensity, or length.  Have signs and symptoms of infection, including a fever.  Have vaginal bleeding.  Have increased swelling in your legs, hands, or face.  Have vision changes, including seeing spots or having blurry or double vision.  Have a severe headache that does not go away.  Have sudden, sharp abdominal pain or low back pain.  Have an uncontrolled gush or trickle of fluid from your vagina. Summary  Intrauterine growth restriction (IUGR) is when a baby is not growing normally during pregnancy.  The most common cause of IUGR is a problem with the placenta or umbilical cord that causes the fetus to get less oxygen or nutrition than needed.  This condition is diagnosed with physical and prenatal exams. Your health care provider will monitor your baby's growth with ultrasounds throughout pregnancy.  Make sure that your health care provider knows about and approves of all the medicines, supplements, vitamins, eye drops, and creams that you use. This information is not intended to replace advice given to you by your health care provider. Make sure you discuss any questions you have with your health care  provider. Document Revised: 05/21/2017 Document Reviewed: 04/08/2017 Elsevier Patient Education  2020 [employer].

## 2020-02-28 NOTE — Progress Notes (Signed)
Routine Prenatal Care Visit  Subjective  Desiree Combs is a 24 y.o. G1P0000 at [redacted]w[redacted]d being seen today for ongoing prenatal care.  She is currently monitored for the following issues for this high-risk pregnancy and has Anxiety; Supervision of high risk pregnancy, antepartum; Obesity affecting pregnancy, antepartum; Blurry vision; [redacted] weeks gestation of pregnancy; and Poor fetal growth affecting management of mother in third trimester on their problem list.  ----------------------------------------------------------------------------------- Patient reports no complaints.  We discussed results of growth scan and doppler reading/BPP, follow up surveillance. She reports work is stressful and she may want to be out soon. She will look into benefits and let us know what date she wants to be out. She denies any s/s of PIH today; no headache, visual changes or epigastric pain. She was seen in triage 2 days ago for blurry vision and elevated BP. Her blood pressure normalized and labs were normal. We discussed symptoms to be aware of/precautions.   Contractions: Not present. Vag. Bleeding: None.  Movement: Present. Leaking Fluid denies.  ----------------------------------------------------------------------------------- The following portions of the patient's history were reviewed and updated as appropriate: allergies, current medications, past family history, past medical history, past social history, past surgical history and problem list. Problem list updated.  Objective  Blood pressure 128/74, weight 228 lb 9.6 oz (103.7 kg), last menstrual period 06/22/2019. Pregravid weight 200 lb (90.7 kg) Total Weight Gain 28 lb 9.6 oz (13 kg) Urinalysis: Urine Protein    Urine Glucose    Fetal Status: Fetal Heart Rate (bpm): 150   Movement: Present  Presentation: Vertex   Growth scan: 10.5%, AC 6.8%, 4 pounds 14 ounces. AFI 13.2 Cord dopplers: max S/D ratio 2.68, BPP 8/8  General:  Alert, oriented and  cooperative. Patient is in no acute distress.  Skin: Skin is warm and dry. No rash noted.   Cardiovascular: Normal heart rate noted  Respiratory: Normal respiratory effort, no problems with respiration noted  Abdomen: Soft, gravid, appropriate for gestational age. Pain/Pressure: Absent     Pelvic:  Cervical exam deferred        Extremities: Normal range of motion.  Edema: None  Mental Status: Normal mood and affect. Normal behavior. Normal judgment and thought content.   Assessment   24 y.o. G1P0000 at [redacted]w[redacted]d by  03/28/2020, by Last Menstrual Period presenting for routine prenatal visit  Plan   pregnancy Problems (from 08/18/19 to present)    Problem Noted Resolved   Supervision of high risk pregnancy, antepartum 08/18/2019 by Rod Can, CNM No   Overview Addendum 02/16/2020  4:48 PM by Imagene Riches, [place] Prenatal Labs  Dating LMP, [place] confirmed 10 wks Blood type: B/Positive/-- (03/15 1521)   Genetic Screen NIPS:declines Antibody:Negative (03/15 1521)  Anatomic [place]  repeated 6/4/2021normal female Rubella: 1.19 (03/15 1521) Varicella: Immune  GTT Early: 172 3-hr: 77 / 170 / 154 / 112              28 wk: WNL RPR: Non Reactive (03/15 1521)   Rhogam  HBsAg: Negative (03/15 1521)   Vaccines TDAP:         7/30              Flu Shot: February 2021 Covid: HIV: Non Reactive (03/15 1521)   Baby Food Breast                               GBS:  Contraception  Pap: 08/02/19 negative  CBB  No   CS/VBAC Desiree   Support Person [name]          Previous Version    Added to High Risk prenatal list   Preterm labor symptoms and general obstetric precautions including but not limited to vaginal bleeding, contractions, leaking of fluid and fetal movement were reviewed in detail with the patient. Please refer to After Visit Summary for other counseling recommendations.   Return in about 1 week (around 03/06/2020) for BPP/cord dopplers/AFI/NST and rob.  GBS next visit   Rod Can, [facility] 02/28/2020 9:23 AM

## 2020-02-28 NOTE — Addendum Note (Signed)
Addended by: Vernia Buff A on: 02/28/2020 11:15 AM   Modules accepted: Orders

## 2020-02-29 ENCOUNTER — Encounter: Payer: Self-pay | Admitting: Advanced Practice Midwife

## 2020-02-29 NOTE — Telephone Encounter (Signed)
Note has been written and available through [facility]

## 2020-03-01 ENCOUNTER — Encounter: Payer: Self-pay | Admitting: Advanced Practice Midwife

## 2020-03-08 ENCOUNTER — Encounter: Payer: Self-pay | Admitting: Obstetrics and Gynecology

## 2020-03-08 ENCOUNTER — Inpatient Hospital Stay
Admission: EM | Admit: 2020-03-08 | Discharge: 2020-03-10 | DRG: 807 | Disposition: A | Discharge status: Home / Self Care | Payer: BC Managed Care – PPO | Attending: Obstetrics and Gynecology | Admitting: Obstetrics and Gynecology

## 2020-03-08 ENCOUNTER — Other Ambulatory Visit: Payer: Self-pay

## 2020-03-08 ENCOUNTER — Other Ambulatory Visit (HOSPITAL_COMMUNITY)
Admission: RE | Admit: 2020-03-08 | Discharge: 2020-03-08 | Disposition: A | Discharge status: Home / Self Care | Payer: BC Managed Care – PPO | Source: Ambulatory Visit | Attending: Advanced Practice Midwife | Admitting: Advanced Practice Midwife

## 2020-03-08 ENCOUNTER — Encounter: Payer: BC Managed Care – PPO | Admitting: Advanced Practice Midwife

## 2020-03-08 ENCOUNTER — Ambulatory Visit (INDEPENDENT_AMBULATORY_CARE_PROVIDER_SITE_OTHER): Payer: BC Managed Care – PPO

## 2020-03-08 ENCOUNTER — Ambulatory Visit (INDEPENDENT_AMBULATORY_CARE_PROVIDER_SITE_OTHER): Payer: BC Managed Care – PPO | Admitting: Advanced Practice Midwife

## 2020-03-08 ENCOUNTER — Encounter: Payer: Self-pay | Admitting: Advanced Practice Midwife

## 2020-03-08 VITALS — BP 140/90 | Wt 227.0 lb

## 2020-03-08 DIAGNOSIS — O365930 Maternal care for other known or suspected poor fetal growth, third trimester, not applicable or unspecified: Secondary | ICD-10-CM

## 2020-03-08 DIAGNOSIS — E669 Obesity, unspecified: Secondary | ICD-10-CM | POA: Diagnosis present

## 2020-03-08 DIAGNOSIS — O0993 Supervision of high risk pregnancy, unspecified, third trimester: Secondary | ICD-10-CM

## 2020-03-08 DIAGNOSIS — O99214 Obesity complicating childbirth: Secondary | ICD-10-CM | POA: Diagnosis present

## 2020-03-08 DIAGNOSIS — O134 Gestational [pregnancy-induced] hypertension without significant proteinuria, complicating childbirth: Principal | ICD-10-CM | POA: Diagnosis present

## 2020-03-08 DIAGNOSIS — O99213 Obesity complicating pregnancy, third trimester: Secondary | ICD-10-CM

## 2020-03-08 DIAGNOSIS — Z20822 Contact with and (suspected) exposure to covid-19: Secondary | ICD-10-CM | POA: Diagnosis present

## 2020-03-08 DIAGNOSIS — Z3685 Encounter for antenatal screening for Streptococcus B: Secondary | ICD-10-CM

## 2020-03-08 DIAGNOSIS — Z3A37 37 weeks gestation of pregnancy: Secondary | ICD-10-CM | POA: Diagnosis present

## 2020-03-08 DIAGNOSIS — Z113 Encounter for screening for infections with a predominantly sexual mode of transmission: Secondary | ICD-10-CM | POA: Diagnosis present

## 2020-03-08 DIAGNOSIS — R030 Elevated blood-pressure reading, without diagnosis of hypertension: Secondary | ICD-10-CM | POA: Diagnosis present

## 2020-03-08 DIAGNOSIS — O0990 Supervision of high risk pregnancy, unspecified, unspecified trimester: Secondary | ICD-10-CM

## 2020-03-08 DIAGNOSIS — O99210 Obesity complicating pregnancy, unspecified trimester: Secondary | ICD-10-CM

## 2020-03-08 DIAGNOSIS — O133 Gestational [pregnancy-induced] hypertension without significant proteinuria, third trimester: Secondary | ICD-10-CM | POA: Diagnosis present

## 2020-03-08 LAB — POCT URINALYSIS DIPSTICK OB
Glucose, UA: NEGATIVE
POC,PROTEIN,UA: NEGATIVE

## 2020-03-08 LAB — COMPREHENSIVE METABOLIC PANEL
ALT: 11 U/L (ref 0–44)
AST: 16 U/L (ref 15–41)
Albumin: 3.1 g/dL — ABNORMAL LOW (ref 3.5–5.0)
Alkaline Phosphatase: 84 U/L (ref 38–126)
Anion gap: 10 (ref 5–15)
BUN: 7 mg/dL (ref 6–20)
CO2: 21 mmol/L — ABNORMAL LOW (ref 22–32)
Calcium: 8.9 mg/dL (ref 8.9–10.3)
Chloride: 105 mmol/L (ref 98–111)
Creatinine, Ser: 0.68 mg/dL (ref 0.44–1.00)
GFR calc Af Amer: >60 mL/min (ref >60)
GFR calc non Af Amer: >60 mL/min (ref >60)
Glucose, Bld: 78 mg/dL (ref 70–99)
Potassium: 4.3 mmol/L (ref 3.5–5.1)
Sodium: 136 mmol/L (ref 135–145)
Total Bilirubin: 0.5 mg/dL (ref 0.3–1.2)
Total Protein: 6.8 g/dL (ref 6.5–8.1)

## 2020-03-08 LAB — CBC
HCT: 34.5 % — ABNORMAL LOW (ref 36.0–46.0)
Hemoglobin: 11.2 g/dL — ABNORMAL LOW (ref 12.0–15.0)
MCH: 26.3 pg (ref 26.0–34.0)
MCHC: 32.5 g/dL (ref 30.0–36.0)
MCV: 81 fL (ref 80.0–100.0)
Platelets: 212 10*3/uL (ref 150–400)
RBC: 4.26 MIL/uL (ref 3.87–5.11)
RDW: 14.4 % (ref 11.5–15.5)
WBC: 9 10*3/uL (ref 4.0–10.5)
nRBC: 0 % (ref 0.0–0.2)

## 2020-03-08 LAB — GROUP B STREP BY PCR: Group B strep by PCR: NEGATIVE

## 2020-03-08 LAB — TYPE AND SCREEN
ABO/RH(D): B POS
Antibody Screen: NEGATIVE

## 2020-03-08 LAB — PROTEIN / CREATININE RATIO, URINE
Creatinine, Urine: 123 mg/dL
Protein Creatinine Ratio: 0.14 mg/mg{Cre} (ref 0.00–0.15)
Total Protein, Urine: 17 mg/dL

## 2020-03-08 LAB — ABO/RH: ABO/RH(D): B POS

## 2020-03-08 LAB — SARS CORONAVIRUS 2 BY RT PCR (HOSPITAL ORDER, PERFORMED IN ~~LOC~~ HOSPITAL LAB): SARS Coronavirus 2: NEGATIVE

## 2020-03-08 MED ORDER — LIDOCAINE HCL (PF) 1 % IJ SOLN
INTRAMUSCULAR | Status: AC
  Filled 2020-03-08: qty 30

## 2020-03-08 MED ORDER — LABETALOL HCL 5 MG/ML IV SOLN: 40.0000 mg | INTRAVENOUS | Status: DC | PRN

## 2020-03-08 MED ORDER — MISOPROSTOL 200 MCG PO TABS
ORAL_TABLET | ORAL | Status: AC
  Filled 2020-03-08: qty 4

## 2020-03-08 MED ORDER — SOD CITRATE-CITRIC ACID 500-334 MG/5ML PO SOLN: 30.0000 mL | ORAL | Status: DC | PRN

## 2020-03-08 MED ORDER — LABETALOL HCL 5 MG/ML IV SOLN: 20.0000 mg | INTRAVENOUS | Status: DC | PRN

## 2020-03-08 MED ORDER — OXYTOCIN 10 UNIT/ML IJ SOLN: 10.0000 [IU] | Freq: Once | INTRAMUSCULAR | Status: DC

## 2020-03-08 MED ORDER — ONDANSETRON HCL 4 MG/2ML IJ SOLN: 4.0000 mg | Freq: Four times a day (QID) | INTRAMUSCULAR | Status: DC | PRN

## 2020-03-08 MED ORDER — LABETALOL HCL 5 MG/ML IV SOLN: 80.0000 mg | INTRAVENOUS | Status: DC | PRN

## 2020-03-08 MED ORDER — TERBUTALINE SULFATE 1 MG/ML IJ SOLN: 0.2500 mg | Freq: Once | INTRAMUSCULAR | Status: DC | PRN

## 2020-03-08 MED ORDER — OXYTOCIN 10 UNIT/ML IJ SOLN
INTRAMUSCULAR | Status: AC
  Filled 2020-03-08: qty 2

## 2020-03-08 MED ORDER — OXYTOCIN-SODIUM CHLORIDE 30-0.9 UT/500ML-% IV SOLN
2.5000 [IU]/h | INTRAVENOUS | Status: DC
  Filled 2020-03-08: qty 500

## 2020-03-08 MED ORDER — AMMONIA AROMATIC IN INHA
RESPIRATORY_TRACT | Status: AC
  Filled 2020-03-08: qty 10

## 2020-03-08 MED ORDER — FAMOTIDINE 20 MG PO TABS: 20.0000 mg | ORAL_TABLET | ORAL | Status: DC

## 2020-03-08 MED ORDER — LACTATED RINGERS IV SOLN: 500.0000 mL | INTRAVENOUS | Status: DC | PRN

## 2020-03-08 MED ORDER — LIDOCAINE HCL (PF) 1 % IJ SOLN: 30.0000 mL | INTRAMUSCULAR | Status: DC | PRN

## 2020-03-08 MED ORDER — OXYTOCIN BOLUS FROM INFUSION
333.0000 mL | Freq: Once | INTRAVENOUS | Status: AC
  Administered 2020-03-09: 333 mL via INTRAVENOUS

## 2020-03-08 MED ORDER — CALCIUM CARBONATE ANTACID 500 MG PO CHEW
1.0000 | CHEWABLE_TABLET | ORAL | Status: AC
  Administered 2020-03-08: 200 mg via ORAL
  Filled 2020-03-08: qty 1

## 2020-03-08 MED ORDER — OXYTOCIN-SODIUM CHLORIDE 30-0.9 UT/500ML-% IV SOLN
1.0000 m[IU]/min | INTRAVENOUS | Status: DC
  Administered 2020-03-08: 1 m[IU]/min via INTRAVENOUS

## 2020-03-08 MED ORDER — LACTATED RINGERS IV SOLN: INTRAVENOUS | Status: DC

## 2020-03-08 NOTE — OB Triage Note (Signed)
Pt sent from office for PIH eval. Pt denies HA, vision changes, or epigastric pain. Pt states no bleeding/LOF/CTX. +FM. BP taken Q15. Will continue to monitor

## 2020-03-08 NOTE — H&P (Signed)
OB History & Physical   History of Present Illness:  Chief Complaint: sent from office for elevated blood pressure  HPI:  Desiree Combs is a 24 y.o. G1P0000 female at [redacted]w[redacted]d dated by LMP consistent with 10 week ultrasound.  Her pregnancy has been complicated by obesity with BMI 39, fetal growth restriction (EFW 10.6%ile, AC 6.5th%ile),.    She reports contractions.   She denies leakage of fluid.   She denies vaginal bleeding.   She reports fetal movement.    Total weight gain for pregnancy: 12.3 kg   Obstetrical Problem List: pregnancy Problems (from 08/18/19 to present)    Problem Noted Resolved   Gestational hypertension, third trimester 03/08/2020 by Will Bonnet, MD No   Supervision of high risk pregnancy, antepartum 08/18/2019 by Rod Can, CNM No   Overview Addendum 02/16/2020  4:48 PM by Imagene Riches, [place] Prenatal Labs  Dating LMP, [place] confirmed 10 wks Blood type: B/Positive/-- (03/15 1521)   Genetic Screen NIPS:declines Antibody:Negative (03/15 1521)  Anatomic [place]  repeated 6/4/2021normal female Rubella: 1.19 (03/15 1521) Varicella: Immune  GTT Early: 172 3-hr: 77 / 170 / 154 / 112              28 wk: WNL RPR: Non Reactive (03/15 1521)   Rhogam  HBsAg: Negative (03/15 1521)   Vaccines TDAP:         7/30              Flu Shot: February 2021 Covid: HIV: Non Reactive (03/15 1521)   Baby Food Breast                               GBS:   Contraception  Pap: 08/02/19 negative  CBB  No   CS/VBAC NA   Support Person [name]          Previous Version       Maternal Medical History:   Past Medical History:  Diagnosis Date  . Anxiety     Past Surgical History:  Procedure Laterality Date  . OPEN REDUCTION INTERNAL FIXATION (ORIF) DISTAL RADIAL FRACTURE Left 03/03/2019   Procedure: OPEN REDUCTION INTERNAL FIXATION (ORIF) DISTAL RADIAL FRACTURE;  Surgeon: Hessie Knows, MD;  Location: [facility] ORS;  Service: Orthopedics;  Laterality: Left;    No  Known Allergies  Prior to Admission medications   Medication Sig Start Date End Date Taking? Authorizing Provider  Prenatal Vit-DSS-Fe Fum-FA (PRENATAL 19) tablet Take 1 tablet by mouth daily.   Yes [provider]    OB History  Gravida Para Term Preterm AB Living  1 0 0 0 0 0  SAB TAB Ectopic Multiple Live Births  0 0 0 0 0    # Outcome Date GA Lbr Len/2nd Weight Sex Delivery Anes PTL Lv  1 Current             Prenatal care site: [facility]  Social History: She  reports that she has never smoked. She has never used smokeless tobacco. She reports that she does not drink alcohol and does not use drugs.  Family History: family history includes Hypertension in her mother.   Review of Systems:  Review of Systems  Constitutional: Negative.   HENT: Negative.   Eyes: Negative.   Respiratory: Negative.   Cardiovascular: Negative.   Gastrointestinal: Negative.   Genitourinary: Negative.   Musculoskeletal: Negative.   Skin: Negative.  Neurological: Negative.   Psychiatric/Behavioral: Negative.      Physical Exam:  BP 122/63   Pulse 86   Temp 99.3 F (37.4 C) (Oral)   Resp 18   Ht 5\' 4"  (1.626 m)   Wt 103 kg   LMP 06/22/2019 (Exact Date)   BMI 38.98 kg/m   BPs 140/90, 150/100, 142/81, 141/85, afterwards (440-102/72-53) Physical Exam Constitutional:      General: She is not in acute distress.    Appearance: Normal appearance. She is well-developed.  HENT:     Head: Normocephalic and atraumatic.  Eyes:     General: No scleral icterus.    Conjunctiva/sclera: Conjunctivae normal.  Cardiovascular:     Rate and Rhythm: Normal rate and regular rhythm.     Heart sounds: No murmur heard.  No friction rub. No gallop.   Pulmonary:     Effort: Pulmonary effort is normal. No respiratory distress.     Breath sounds: Normal breath sounds. No wheezing or rales.  Abdominal:     General: Bowel sounds are normal. There is no distension.     Palpations: Abdomen is  soft. There is mass (gravid/nt).     Tenderness: There is no abdominal tenderness. There is no guarding or rebound.  Musculoskeletal:        General: Normal range of motion.     Cervical back: Normal range of motion and neck supple.  Neurological:     General: No focal deficit present.     Mental Status: She is alert and oriented to person, place, and time.     Cranial Nerves: No cranial nerve deficit.     Deep Tendon Reflexes: Reflexes normal.  Skin:    General: Skin is warm and dry.     Findings: No erythema.  Psychiatric:        Mood and Affect: Mood normal.        Behavior: Behavior normal.        Judgment: Judgment normal.    Lab Results  Component Value Date   WBC 9.0 03/08/2020   HGB 11.2 (L) 03/08/2020   HCT 34.5 (L) 03/08/2020   PLT 212 03/08/2020   CREATININE 0.68 03/08/2020   ALT 11 03/08/2020   AST 16 03/08/2020   PROTCRRATIO 0.14 03/08/2020      Baseline FHR: 140 beats/min   Variability: moderate   Accelerations: present   Decelerations: absent Contractions: present frequency: 2-3 q 10 min Overall assessment: cat 1  Ultrasound in clinic today:  Number of Fetus: 1  Presentation: cephalic  Fluid: normal  Placental Location: left lateral   Assessment:  Desiree Combs is a 24 y.o. G1P0000 female at [redacted]w[redacted]d with gestational hypertension, fetal growth restriction.   Plan:  1. Admit to Labor & Delivery  2. CBC, T&S, Clrs, IVF 3. GBS unknown - collect rapid GBS today.   4. Fetwal well-being: reassuring 5. She has had elevated blood pressure readings over several weeks, though they are mostly normal.  She does meet criteria for gestational hypertension.  Given this and the fact that she has a growth restricted fetus, I recommended to her and her husband that we move forward with delivery.  A detailed explanation was given, including alternative options of management.  They agreed with the plan of moving to delivery.   Prentice Docker, MD 03/08/2020 7:10 PM

## 2020-03-08 NOTE — Patient Instructions (Signed)

## 2020-03-08 NOTE — Progress Notes (Signed)
Routine Prenatal Care Visit  Subjective  Desiree Combs is a 24 y.o. G1P0000 at [redacted]w[redacted]d being seen today for ongoing prenatal care.  She is currently monitored for the following issues for this high-risk pregnancy and has Anxiety; Supervision of high risk pregnancy, antepartum; Obesity affecting pregnancy, antepartum; Blurry vision; [redacted] weeks gestation of pregnancy; Poor fetal growth affecting management of mother in third trimester; and [redacted] weeks gestation of pregnancy on their problem list.  ----------------------------------------------------------------------------------- Patient reports spots in vision and occasional RUQ pain. She had a headache recently that was relieved by tylenol. We discussed results of u/s today and given elevated BP today recommend she go to L&D for [facility] evaluation.  Also recommend IOL by 38 weeks. Contractions: Not present. Vag. Bleeding: None.  Movement: Present. Leaking Fluid denies.  ----------------------------------------------------------------------------------- The following portions of the patient's history were reviewed and updated as appropriate: allergies, current medications, past family history, past medical history, past social history, past surgical history and problem list. Problem list updated.  Objective  Blood pressure 140/90, weight 227 lb (103 kg), last menstrual period 06/22/2019. BP recheck: 150/100 Pregravid weight 200 lb (90.7 kg) Total Weight Gain 27 lb 1.2 oz (12.3 kg) Urinalysis: Urine Protein Negative  Urine Glucose Negative  Fetal Status: Fetal Heart Rate (bpm): 148   Movement: Present  Presentation: Vertex   NST: reactive 20 minute tracing, 150 bpm baseline, moderate variability, +accelerations, -decelerations AFI: 16.1 cm, Dopplers s/d ratio 2.9 without reversal or absence of diastolic flow, BPP: 8/8  General:  Alert, oriented and cooperative. Patient is in no acute distress.  Skin: Skin is warm and dry. No rash noted.     Cardiovascular: Normal heart rate noted  Respiratory: Normal respiratory effort, no problems with respiration noted  Abdomen: Soft, gravid, appropriate for gestational age. Pain/Pressure: Present     Pelvic:  Cervical exam performed Dilation: 3 Effacement (%): 70 Station: -3, GBS/aptima today  Extremities: Normal range of motion.  Edema: None  Mental Status: Normal mood and affect. Normal behavior. Normal judgment and thought content.   Assessment   24 y.o. G1P0000 at [redacted]w[redacted]d by  03/28/2020, by Last Menstrual Period presenting for routine prenatal visit  Plan   pregnancy Problems (from 08/18/19 to present)    Problem Noted Resolved   Supervision of high risk pregnancy, antepartum 08/18/2019 by Rod Can, CNM No   Overview Addendum 02/16/2020  4:48 PM by Imagene Riches, [place] Prenatal Labs  Dating LMP, [place] confirmed 10 wks Blood type: B/Positive/-- (03/15 1521)   Genetic Screen NIPS:declines Antibody:Negative (03/15 1521)  Anatomic [place]  repeated 6/4/2021normal female Rubella: 1.19 (03/15 1521) Varicella: Immune  GTT Early: 172 3-hr: 77 / 170 / 154 / 112              28 wk: WNL RPR: Non Reactive (03/15 1521)   Rhogam  HBsAg: Negative (03/15 1521)   Vaccines TDAP:         7/30              Flu Shot: February 2021 Covid: HIV: Non Reactive (03/15 1521)   Baby Food Breast                               GBS:   Contraception  Pap: 08/02/19 negative  CBB  No   CS/VBAC NA   Support Person [name]          Previous Version  Term labor symptoms and general obstetric precautions including but not limited to vaginal bleeding, contractions, leaking of fluid and fetal movement were reviewed in detail with the patient. Please refer to After Visit Summary for other counseling recommendations.  Go to L&D for [facility] evaluation and schedule induction   Return for iol in 1 week.  Rod Can, CNM 03/08/2020 4:42 PM

## 2020-03-09 ENCOUNTER — Encounter: Payer: Self-pay | Admitting: Obstetrics and Gynecology

## 2020-03-09 ENCOUNTER — Inpatient Hospital Stay: Payer: BC Managed Care – PPO | Admitting: Anesthesiology

## 2020-03-09 DIAGNOSIS — Z3A37 37 weeks gestation of pregnancy: Secondary | ICD-10-CM

## 2020-03-09 DIAGNOSIS — O365930 Maternal care for other known or suspected poor fetal growth, third trimester, not applicable or unspecified: Secondary | ICD-10-CM | POA: Diagnosis present

## 2020-03-09 LAB — RPR: RPR Ser Ql: NONREACTIVE

## 2020-03-09 MED ORDER — PHENYLEPHRINE 40 MCG/ML (10ML) SYRINGE FOR IV PUSH (FOR BLOOD PRESSURE SUPPORT): 80.0000 ug | PREFILLED_SYRINGE | INTRAVENOUS | Status: DC | PRN

## 2020-03-09 MED ORDER — SENNOSIDES-DOCUSATE SODIUM 8.6-50 MG PO TABS: 2.0000 | ORAL_TABLET | ORAL | Status: DC

## 2020-03-09 MED ORDER — HYDROCODONE-ACETAMINOPHEN 5-325 MG PO TABS: 1.0000 | ORAL_TABLET | Freq: Three times a day (TID) | ORAL | Status: DC | PRN

## 2020-03-09 MED ORDER — ONDANSETRON HCL 4 MG PO TABS
4.0000 mg | ORAL_TABLET | ORAL | Status: DC | PRN
  Filled 2020-03-09: qty 1

## 2020-03-09 MED ORDER — LACTATED RINGERS IV SOLN
500.0000 mL | Freq: Once | INTRAVENOUS | Status: AC
  Administered 2020-03-09: 500 mL via INTRAVENOUS

## 2020-03-09 MED ORDER — DIBUCAINE (PERIANAL) 1 % EX OINT: 1.0000 "application " | TOPICAL_OINTMENT | CUTANEOUS | Status: DC | PRN

## 2020-03-09 MED ORDER — FENTANYL 2.5 MCG/ML W/ROPIVACAINE 0.15% IN NS 100 ML EPIDURAL (ARMC)
EPIDURAL | Status: AC
  Filled 2020-03-09: qty 100

## 2020-03-09 MED ORDER — ONDANSETRON HCL 4 MG/2ML IJ SOLN: 4.0000 mg | INTRAMUSCULAR | Status: DC | PRN

## 2020-03-09 MED ORDER — BUPIVACAINE HCL (PF) 0.25 % IJ SOLN
INTRAMUSCULAR | Status: DC | PRN
  Administered 2020-03-09: 5 mL via EPIDURAL
  Administered 2020-03-09: 3 mL via EPIDURAL

## 2020-03-09 MED ORDER — PRENATAL MULTIVITAMIN CH
1.0000 | ORAL_TABLET | Freq: Every day | ORAL | Status: DC
  Administered 2020-03-09 – 2020-03-10 (×2): 1 via ORAL
  Filled 2020-03-09 (×2): qty 1

## 2020-03-09 MED ORDER — EPHEDRINE 5 MG/ML INJ: 10.0000 mg | INTRAVENOUS | Status: DC | PRN

## 2020-03-09 MED ORDER — COCONUT OIL OIL
1.0000 "application " | TOPICAL_OIL | Status: DC | PRN
  Administered 2020-03-09: 1 via TOPICAL
  Filled 2020-03-09: qty 120

## 2020-03-09 MED ORDER — FERROUS SULFATE 325 (65 FE) MG PO TABS
325.0000 mg | ORAL_TABLET | Freq: Two times a day (BID) | ORAL | Status: DC
  Administered 2020-03-10: 325 mg via ORAL
  Filled 2020-03-09: qty 1

## 2020-03-09 MED ORDER — DIPHENHYDRAMINE HCL 25 MG PO CAPS: 25.0000 mg | ORAL_CAPSULE | Freq: Four times a day (QID) | ORAL | Status: DC | PRN

## 2020-03-09 MED ORDER — FENTANYL 2.5 MCG/ML W/ROPIVACAINE 0.15% IN NS 100 ML EPIDURAL (ARMC)
EPIDURAL | Status: DC | PRN
Start: 2020-03-09 — End: 2020-03-09
  Administered 2020-03-09: 12 mL/h via EPIDURAL

## 2020-03-09 MED ORDER — SIMETHICONE 80 MG PO CHEW: 80.0000 mg | CHEWABLE_TABLET | ORAL | Status: DC | PRN

## 2020-03-09 MED ORDER — LIDOCAINE HCL (PF) 1 % IJ SOLN
INTRAMUSCULAR | Status: DC | PRN
  Administered 2020-03-09: 4 mL via SUBCUTANEOUS

## 2020-03-09 MED ORDER — DIPHENHYDRAMINE HCL 50 MG/ML IJ SOLN: 12.5000 mg | INTRAMUSCULAR | Status: DC | PRN

## 2020-03-09 MED ORDER — ACETAMINOPHEN 325 MG PO TABS
650.0000 mg | ORAL_TABLET | ORAL | Status: DC | PRN
  Administered 2020-03-09 – 2020-03-10 (×3): 650 mg via ORAL
  Filled 2020-03-09 (×3): qty 2

## 2020-03-09 MED ORDER — LIDOCAINE-EPINEPHRINE (PF) 1.5 %-1:200000 IJ SOLN
INTRAMUSCULAR | Status: DC | PRN
  Administered 2020-03-09: 4 mL via EPIDURAL

## 2020-03-09 MED ORDER — FENTANYL 2.5 MCG/ML W/ROPIVACAINE 0.15% IN NS 100 ML EPIDURAL (ARMC): 12.0000 mL/h | EPIDURAL | Status: DC

## 2020-03-09 MED ORDER — BENZOCAINE-MENTHOL 20-0.5 % EX AERO: 1.0000 "application " | INHALATION_SPRAY | CUTANEOUS | Status: DC | PRN

## 2020-03-09 MED ORDER — IBUPROFEN 600 MG PO TABS
600.0000 mg | ORAL_TABLET | Freq: Four times a day (QID) | ORAL | Status: DC
  Administered 2020-03-09: 600 mg via ORAL
  Filled 2020-03-09: qty 1

## 2020-03-09 MED ORDER — OXYTOCIN-SODIUM CHLORIDE 30-0.9 UT/500ML-% IV SOLN
INTRAVENOUS | Status: AC
  Filled 2020-03-09: qty 500

## 2020-03-09 MED ORDER — WITCH HAZEL-GLYCERIN EX PADS: 1.0000 "application " | MEDICATED_PAD | CUTANEOUS | Status: DC | PRN

## 2020-03-09 MED ORDER — IBUPROFEN 600 MG PO TABS
600.0000 mg | ORAL_TABLET | Freq: Four times a day (QID) | ORAL | Status: DC
  Administered 2020-03-09 – 2020-03-10 (×2): 600 mg via ORAL
  Filled 2020-03-09 (×2): qty 1

## 2020-03-09 NOTE — Discharge Summary (Addendum)
Postpartum Discharge Summary    Patient Name: Desiree Combs DOB: [birth year] MRN: [id number]  Date of admission: 03/08/2020 Delivery date:03/09/2020  Delivering provider: Prentice Docker D  Date of discharge: 03/10/2020  Admitting diagnosis: Gestational hypertension, third trimester [O13.3] [redacted] weeks gestation of pregnancy [Z3A.37] Intrauterine pregnancy: [redacted]w[redacted]d    Secondary diagnosis:  Principal Problem:   Gestational hypertension, third trimester Active Problems:   Supervision of high risk pregnancy, antepartum   Obesity affecting pregnancy, antepartum   [redacted] weeks gestation of pregnancy   Intrauterine growth restriction (IUGR) affecting care of mother, third trimester, single gestation  Additional problems: none    Discharge diagnosis: Term Pregnancy Delivered and Gestational Hypertension                                              Post partum procedures:none Augmentation: Pitocin Complications: None  Hospital course: Induction of Labor With Vaginal Delivery   24y.o. yo G1P0000 at 381w2das admitted to the hospital 03/08/2020 for induction of labor.  Indication for induction: Gestational hypertension and fetal growth restriction.  Patient had an uncomplicated labor course as follows: Membrane Rupture Time/Date: 4:15 AM ,03/09/2020   Delivery Method:Vaginal, Spontaneous  Episiotomy: None  Lacerations:  2nd degree;Vaginal;Perineal  Details of delivery can be found in separate delivery note.  Patient had a routine postpartum course. Patient is discharged home 03/10/20.  Newborn Data: Birth date:03/09/2020  Birth time:6:48 AM  Gender:Female  "AuWilber OliphantLiving status:  Apgars:9 ,10  Weight:2460 g   Magnesium Sulfate received: No BMZ received: No Rhophylac:No MMR:No T-DaP:Given prenatally on 01/19/2020 Flu: Yes on 02/23/2020 Transfusion:No  Physical exam  Vitals:   03/09/20 1509 03/09/20 1947 03/09/20 2310 03/10/20 0815  BP: 120/78 136/83 129/73 126/77  Pulse: 99  84 79 77  Resp: _0 Temp: 99 F (37.2 C) 98.4 F (36.9 C) 98.2 F (36.8 C) 98.7 F (37.1 C)  TempSrc: Oral Oral Oral Oral  SpO2: 99% 99% 99% (!) 84%  Weight:      Height:       General: alert, cooperative and no distress Lochia: appropriate Uterine Fundus: firm DVT Evaluation: No evidence of DVT seen on physical exam. Labs: Lab Results  Component Value Date   WBC 10.4 03/10/2020   HGB 10.6 (L) 03/10/2020   HCT 31.5 (L) 03/10/2020   MCV 79.3 (L) 03/10/2020   PLT 201 03/10/2020   CMP Latest Ref Rng & Units 03/08/2020  Glucose 70 - 99 mg/dL 78  BUN 6 - 20 mg/dL 7  Creatinine 0.44 - 1.00 mg/dL 0.68  Sodium 135 - 145 mmol/L 136  Potassium 3.5 - 5.1 mmol/L 4.3  Chloride 98 - 111 mmol/L 105  CO2 22 - 32 mmol/L 21(L)  Calcium 8.9 - 10.3 mg/dL 8.9  Total Protein 6.5 - 8.1 g/dL 6.8  Total Bilirubin 0.3 - 1.2 mg/dL 0.5  Alkaline Phos 38 - 126 U/L 84  AST 15 - 41 U/L 16  ALT 0 - 44 U/L 11   Edinburgh Score: Edinburgh Postnatal Depression Scale Screening Tool 03/10/2020  I have been able to laugh and see the funny side of things. 0  I have looked forward with enjoyment to things. 1  I have blamed myself unnecessarily when things went wrong. 2  I have been anxious or worried for no good reason. 3  I  have felt scared or panicky for no good reason. 3  Things have been getting on top of me. 1  I have been so unhappy that I have had difficulty sleeping. 0  I have felt sad or miserable. 1  I have been so unhappy that I have been crying. 1  The thought of harming myself has occurred to me. 0  Edinburgh Postnatal Depression Scale Total 12      After visit meds:  Allergies as of 03/10/2020   No Known Allergies     Medication List    TAKE these medications   ibuprofen 600 MG tablet Commonly known as: ADVIL Take 1 tablet (600 mg total) by mouth every 6 (six) hours.   PARoxetine 10 MG tablet Commonly known as: Paxil Take 1 tablet (10 mg total) by mouth daily.     Prenatal 19 tablet Take 1 tablet by mouth daily.        Discharge home in stable condition Infant Feeding: Breast Infant Disposition:home with mother Discharge instruction: per After Visit Summary and Postpartum booklet. Activity: Advance as tolerated. Pelvic rest for 6 weeks.  Diet: routine diet Anticipated Birth Control: declines Postpartum Appointment:2 weeks Additional Postpartum F/U: Postpartum Depression checkup Future Appointments:No future appointments. Follow up Visit:  Follow-up Information    Will Bonnet, MD. Schedule an appointment as soon as possible for a visit in 2 week(s).   Specialty: Obstetrics and Gynecology Why: Mood check, BP check Contact information: [address] [place] [address] [phone]               SIGNED:  [name], MD, [place], [facility] 03/10/2020, 9:27 AM

## 2020-03-09 NOTE — Anesthesia Procedure Notes (Signed)
Epidural Patient location during procedure: OB  Staffing Performed: anesthesiologist   Preanesthetic Checklist Completed: patient identified, IV checked, site marked, risks and benefits discussed, surgical consent, monitors and equipment checked, pre-op evaluation and timeout performed  Epidural Patient position: sitting Prep: Betadine Patient monitoring: heart rate, continuous pulse ox and blood pressure Approach: midline Location: L4-L5 Injection technique: LOR saline  Needle:  Needle type: Tuohy  Needle gauge: 17 G Needle length: 9 cm and 9 Needle insertion depth: 5 cm Catheter type: closed end flexible Catheter size: 19 Gauge Test dose: negative and 1.5% lidocaine with Epi 1:200 K  Assessment Sensory level: T11 Events: blood not aspirated, injection not painful, no injection resistance, no paresthesia and negative IV test  Additional Notes   Patient tolerated the insertion well without complications.-SATD -IVTD. No paresthesia. Refer to [place] for VS and dosingReason for block:procedure for pain

## 2020-03-09 NOTE — Anesthesia Preprocedure Evaluation (Signed)
Anesthesia Evaluation  Patient identified by MRN, date of birth, ID band Patient awake    Reviewed: Allergy & Precautions, H&P , NPO status , Patient's Chart, lab work & pertinent test results, reviewed documented beta blocker date and time   Airway Mallampati: II  TM Distance: >3 FB Neck ROM: full    Dental no notable dental hx. (+) Teeth Intact   Pulmonary neg pulmonary ROS, Current Smoker,    Pulmonary exam normal breath sounds clear to auscultation       Cardiovascular Exercise Tolerance: Good hypertension, On Medications  Rhythm:regular Rate:Normal     Neuro/Psych Anxiety negative neurological ROS  negative psych ROS   GI/Hepatic negative GI ROS, Neg liver ROS,   Endo/Other  negative endocrine ROSdiabetes, Gestational  Renal/GU      Musculoskeletal   Abdominal   Peds  Hematology negative hematology ROS (+)   Anesthesia Other Findings   Reproductive/Obstetrics (+) Pregnancy                             Anesthesia Physical Anesthesia Plan  ASA: II  Anesthesia Plan: Epidural   Post-op Pain Management:    Induction:   PONV Risk Score and Plan:   Airway Management Planned:   Additional Equipment:   Intra-op Plan:   Post-operative Plan:   Informed Consent: I have reviewed the patients History and Physical, chart, labs and discussed the procedure including the risks, benefits and alternatives for the proposed anesthesia with the patient or authorized representative who has indicated his/her understanding and acceptance.       Plan Discussed with:   Anesthesia Plan Comments:         Anesthesia Quick Evaluation

## 2020-03-10 LAB — CBC
HCT: 31.5 % — ABNORMAL LOW (ref 36.0–46.0)
Hemoglobin: 10.6 g/dL — ABNORMAL LOW (ref 12.0–15.0)
MCH: 26.7 pg (ref 26.0–34.0)
MCHC: 33.7 g/dL (ref 30.0–36.0)
MCV: 79.3 fL — ABNORMAL LOW (ref 80.0–100.0)
Platelets: 201 10*3/uL (ref 150–400)
RBC: 3.97 MIL/uL (ref 3.87–5.11)
RDW: 15 % (ref 11.5–15.5)
WBC: 10.4 10*3/uL (ref 4.0–10.5)
nRBC: 0 % (ref 0.0–0.2)

## 2020-03-10 LAB — STREP GP B NAA: Strep Gp B NAA: NEGATIVE

## 2020-03-10 MED ORDER — PAROXETINE HCL 10 MG PO TABS: 10.0000 mg | ORAL_TABLET | Freq: Every day | ORAL | 2 refills | Status: DC

## 2020-03-10 MED ORDER — IBUPROFEN 600 MG PO TABS: 600.0000 mg | ORAL_TABLET | Freq: Four times a day (QID) | ORAL | 0 refills | Status: DC

## 2020-03-10 NOTE — Anesthesia Postprocedure Evaluation (Signed)
Anesthesia Post Note  Patient: Desiree Combs  Procedure(s) Performed: AN AD HOC LABOR EPIDURAL  Patient location during evaluation: Mother Baby Anesthesia Type: Epidural Level of consciousness: awake and alert Pain management: pain level controlled Vital Signs Assessment: post-procedure vital signs reviewed and stable Respiratory status: spontaneous breathing, nonlabored ventilation and respiratory function stable Cardiovascular status: stable Postop Assessment: no headache, no backache, patient able to bend at knees and able to ambulate Anesthetic complications: no   No complications documented.   Last Vitals:  Vitals:   03/09/20 2310 03/10/20 0815  BP: 129/73 126/77  Pulse: 79 77  Resp: 18 18  Temp: 36.8 C 37.1 C  SpO2: 99% (!) 84%    Last Pain:  Vitals:   03/10/20 0815  TempSrc: Oral  PainSc:                  [name]

## 2020-03-10 NOTE — Plan of Care (Signed)
D/C order from MD.  Reviewed d/c instructions and prescriptions with patient and answered any questions.  Patient d/c home with infant via wheelchair by nursing/auxillary. 

## 2020-03-11 ENCOUNTER — Encounter: Payer: Self-pay | Admitting: Obstetrics & Gynecology

## 2020-03-11 ENCOUNTER — Observation Stay
Admission: RE | Admit: 2020-03-11 | Discharge: 2020-03-11 | Disposition: A | Discharge status: Home / Self Care | Payer: BC Managed Care – PPO | Source: Ambulatory Visit | Attending: Obstetrics & Gynecology | Admitting: Obstetrics & Gynecology

## 2020-03-11 ENCOUNTER — Telehealth: Payer: Self-pay

## 2020-03-11 ENCOUNTER — Other Ambulatory Visit: Payer: Self-pay

## 2020-03-11 ENCOUNTER — Ambulatory Visit (INDEPENDENT_AMBULATORY_CARE_PROVIDER_SITE_OTHER): Payer: BC Managed Care – PPO | Admitting: Obstetrics & Gynecology

## 2020-03-11 ENCOUNTER — Observation Stay: Payer: BC Managed Care – PPO

## 2020-03-11 VITALS — BP 140/100 | Ht 64.0 in | Wt 221.0 lb

## 2020-03-11 DIAGNOSIS — R079 Chest pain, unspecified: Secondary | ICD-10-CM | POA: Diagnosis not present

## 2020-03-11 DIAGNOSIS — O165 Unspecified maternal hypertension, complicating the puerperium: Secondary | ICD-10-CM | POA: Diagnosis present

## 2020-03-11 DIAGNOSIS — R030 Elevated blood-pressure reading, without diagnosis of hypertension: Secondary | ICD-10-CM | POA: Diagnosis not present

## 2020-03-11 DIAGNOSIS — R0789 Other chest pain: Secondary | ICD-10-CM | POA: Diagnosis not present

## 2020-03-11 DIAGNOSIS — O99893 Other specified diseases and conditions complicating puerperium: Secondary | ICD-10-CM | POA: Insufficient documentation

## 2020-03-11 DIAGNOSIS — O1495 Unspecified pre-eclampsia, complicating the puerperium: Secondary | ICD-10-CM | POA: Diagnosis present

## 2020-03-11 HISTORY — DX: Essential (primary) hypertension: I10

## 2020-03-11 LAB — COMPREHENSIVE METABOLIC PANEL
ALT: 14 U/L (ref 0–44)
AST: 21 U/L (ref 15–41)
Albumin: 2.9 g/dL — ABNORMAL LOW (ref 3.5–5.0)
Alkaline Phosphatase: 68 U/L (ref 38–126)
Anion gap: 9 (ref 5–15)
BUN: 8 mg/dL (ref 6–20)
CO2: 22 mmol/L (ref 22–32)
Calcium: 8.1 mg/dL — ABNORMAL LOW (ref 8.9–10.3)
Chloride: 107 mmol/L (ref 98–111)
Creatinine, Ser: 0.56 mg/dL (ref 0.44–1.00)
GFR calc Af Amer: >60 mL/min (ref >60)
GFR calc non Af Amer: >60 mL/min (ref >60)
Glucose, Bld: 82 mg/dL (ref 70–99)
Potassium: 3.5 mmol/L (ref 3.5–5.1)
Sodium: 138 mmol/L (ref 135–145)
Total Bilirubin: 0.4 mg/dL (ref 0.3–1.2)
Total Protein: 6.3 g/dL — ABNORMAL LOW (ref 6.5–8.1)

## 2020-03-11 LAB — TROPONIN I (HIGH SENSITIVITY): Troponin I (High Sensitivity): 6 ng/L (ref <18)

## 2020-03-11 LAB — TYPE AND SCREEN
ABO/RH(D): B POS
Antibody Screen: NEGATIVE

## 2020-03-11 LAB — PROTEIN / CREATININE RATIO, URINE
Creatinine, Urine: 99 mg/dL
Protein Creatinine Ratio: 0.24 mg/mg{Cre} — ABNORMAL HIGH (ref 0.00–0.15)
Total Protein, Urine: 24 mg/dL

## 2020-03-11 LAB — CBC
HCT: 32.8 % — ABNORMAL LOW (ref 36.0–46.0)
Hemoglobin: 10.7 g/dL — ABNORMAL LOW (ref 12.0–15.0)
MCH: 26.2 pg (ref 26.0–34.0)
MCHC: 32.6 g/dL (ref 30.0–36.0)
MCV: 80.4 fL (ref 80.0–100.0)
Platelets: 201 10*3/uL (ref 150–400)
RBC: 4.08 MIL/uL (ref 3.87–5.11)
RDW: 14.9 % (ref 11.5–15.5)
WBC: 9.6 10*3/uL (ref 4.0–10.5)
nRBC: 0 % (ref 0.0–0.2)

## 2020-03-11 IMAGING — CT CT ANGIO CHEST
2 of 6 series · 19 of 46 positions shown · IV contrast (APPLIED)
Comparison: None.

CLINICAL DATA: Sharp chest pain, 2 days postpartum

EXAM:
CT ANGIOGRAPHY CHEST WITH CONTRAST
TECHNIQUE: Multidetector CT imaging of the chest was performed using the
standard protocol during bolus administration of intravenous
contrast. Multiplanar CT image reconstructions and MIPs were
obtained to evaluate the vascular anatomy.
CONTRAST:  75mL OMNIPAQUE IOHEXOL 350 MG/ML SOLN

[Series 5: thins · axial · 0.64mm/px · z∈[-290,-48]mm · 17 of 266 slices shown]
[im 12/266  lung]
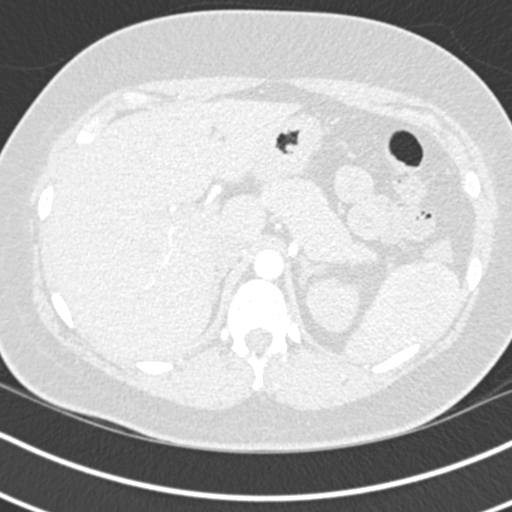
[im 24/266  soft-tissue]
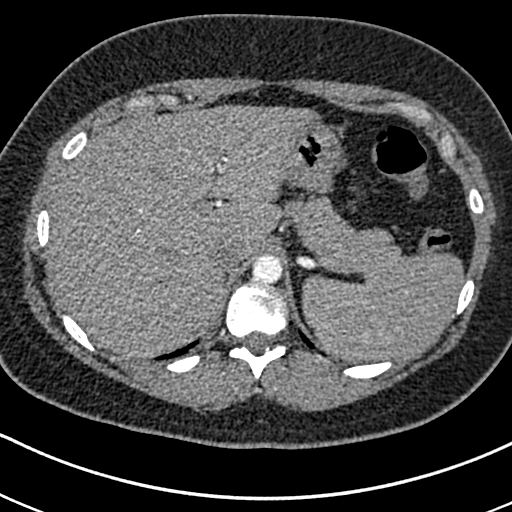
[im 47/266  lung]
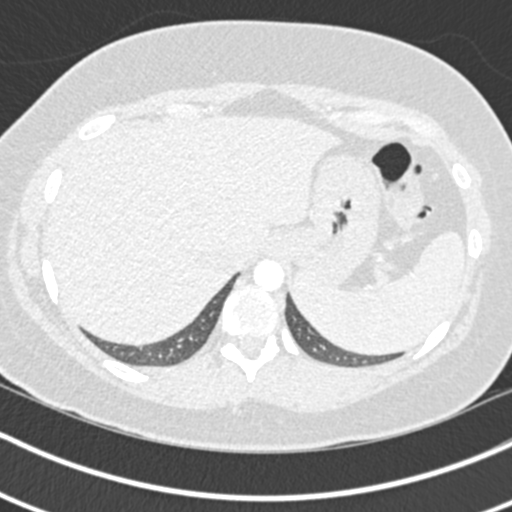
[im 58/266  soft-tissue]
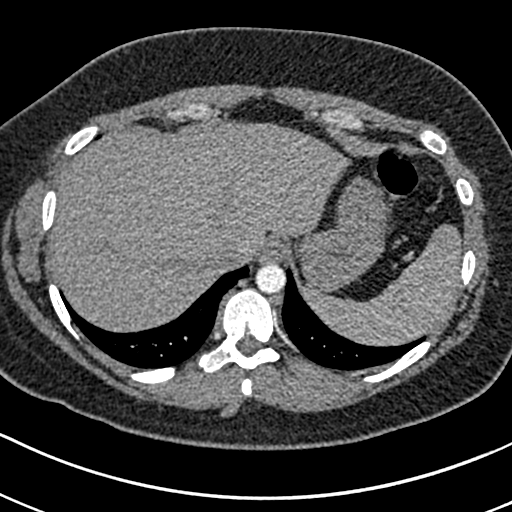
[im 70/266  lung]
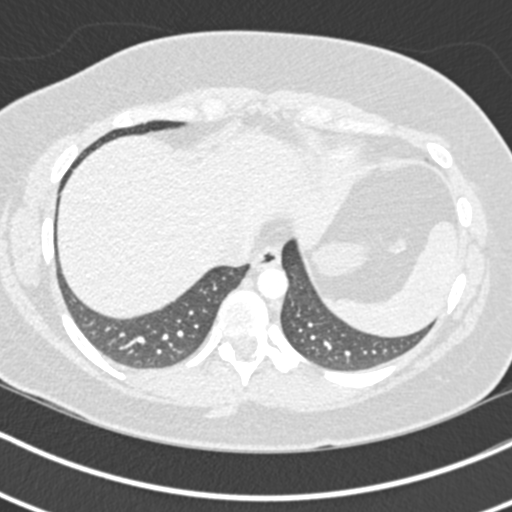
[im 93/266  soft-tissue]
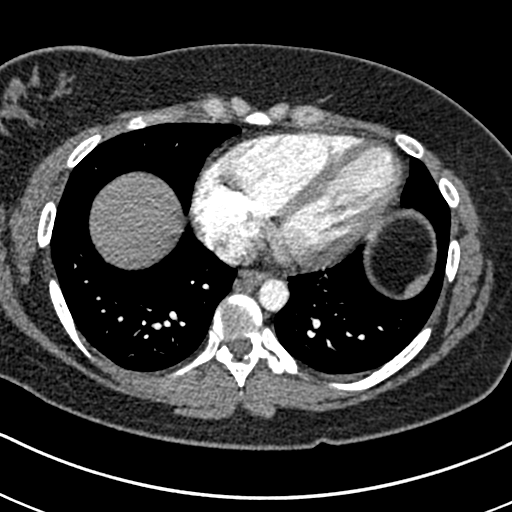
[im 104/266  lung]
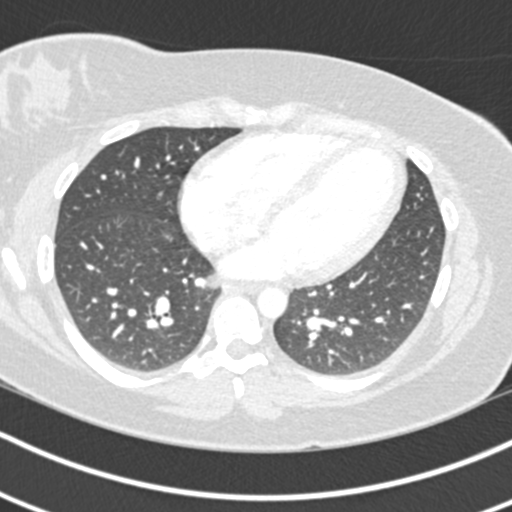
[im 116/266  soft-tissue]
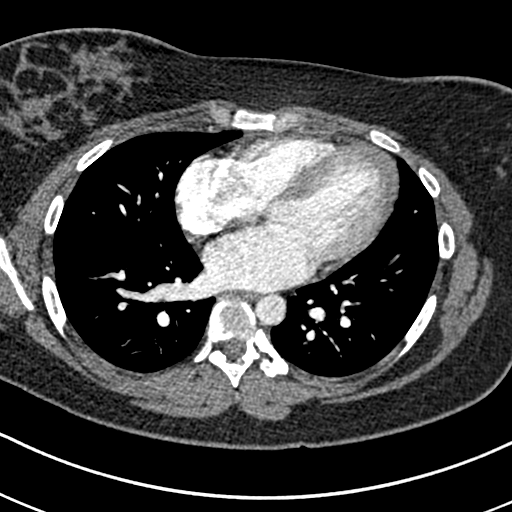
[im 139/266  lung]
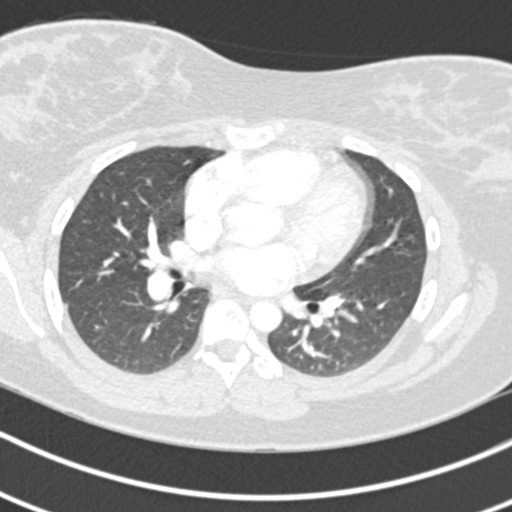
[im 150/266  soft-tissue]
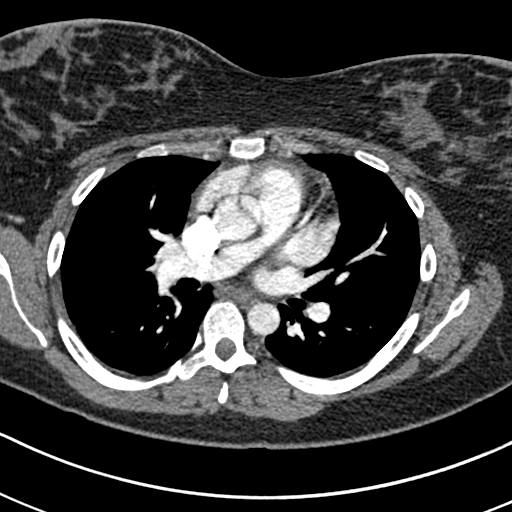
[im 162/266  lung]
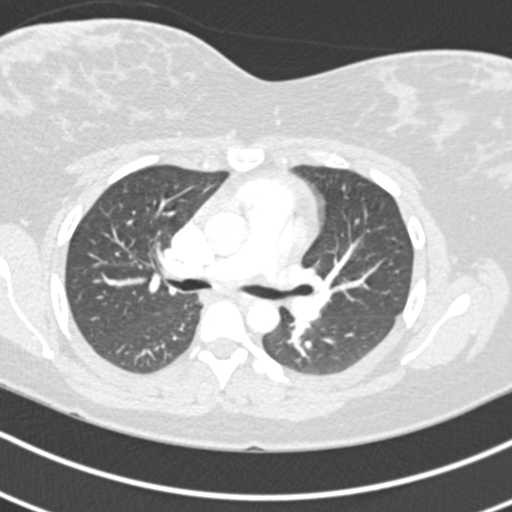
[im 173/266  soft-tissue]
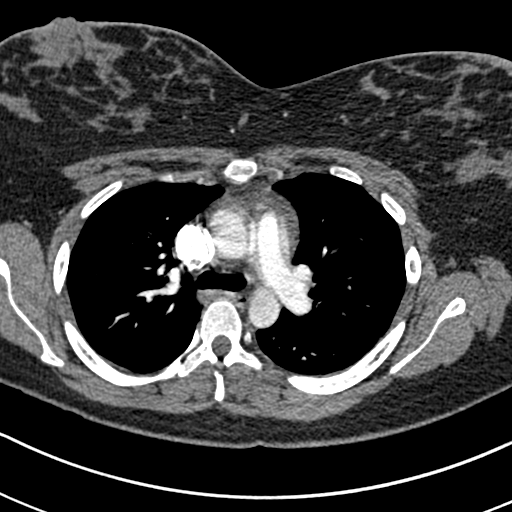
[im 196/266  lung]
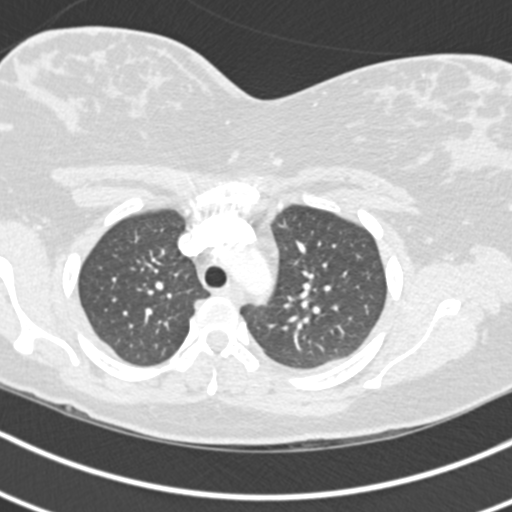
[im 208/266  soft-tissue]
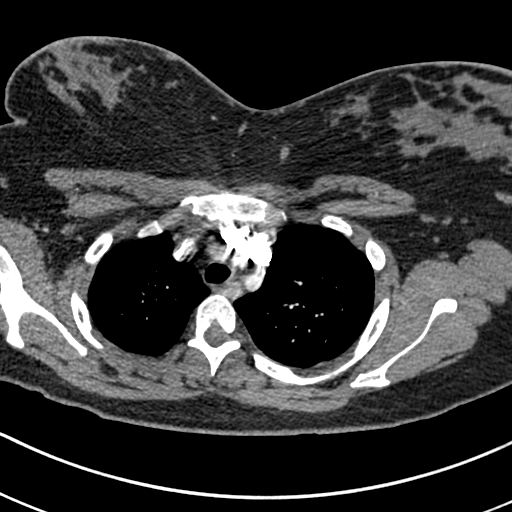
[im 219/266  lung]
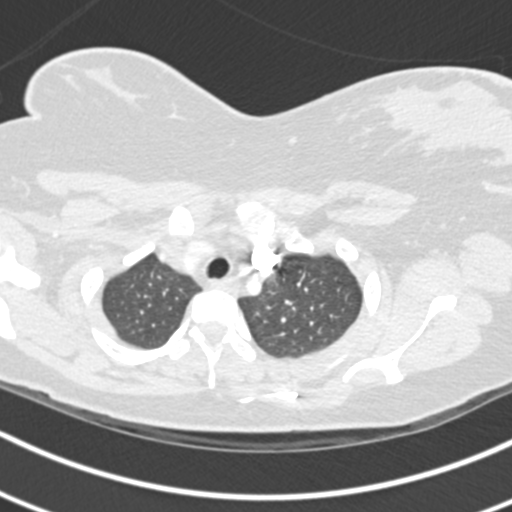
[im 242/266  soft-tissue]
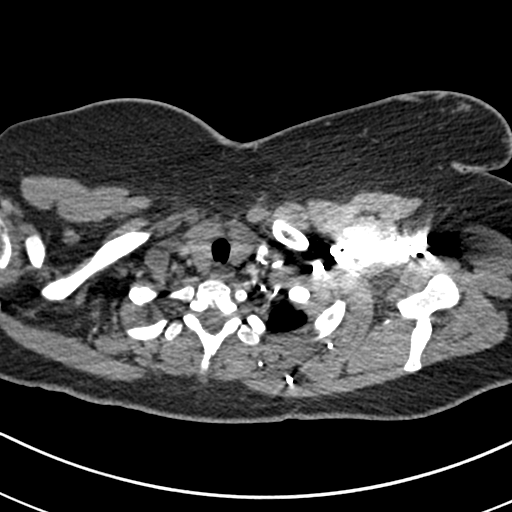
[im 254/266  lung]
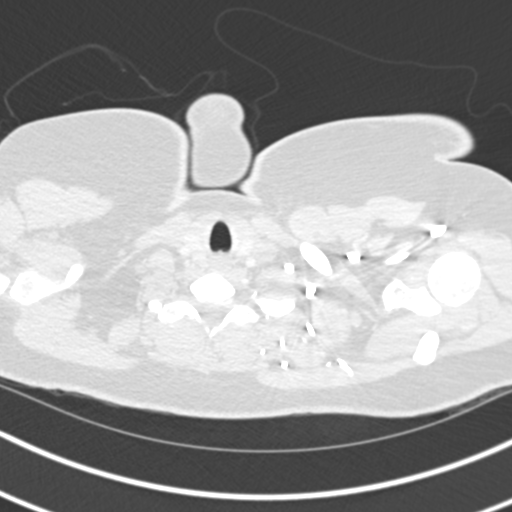

[Series 7: coronal mpr · coronal · 0.52mm/px · 2 of 83 slices shown]
[im 28/83  soft-tissue]
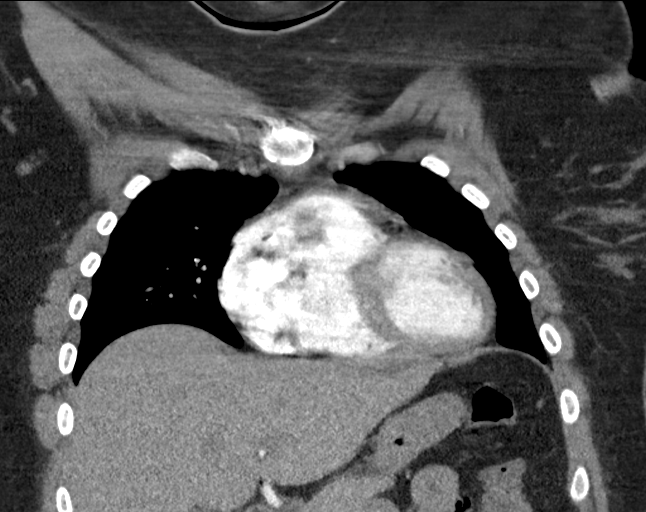
[im 55/83  soft-tissue]
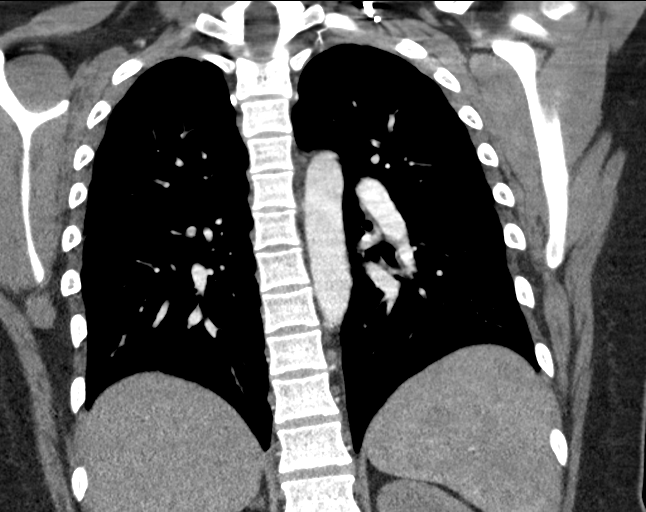

[19 of 46 positions shown; findings below may reference images not displayed]

FINDINGS: Cardiovascular: This is a technically adequate evaluation of the
pulmonary vasculature. No filling defects or pulmonary emboli. The
heart is mildly enlarged, consistent with recent postpartum state.
No pericardial effusion. Thoracic aorta is unremarkable.

Mediastinum/Nodes: No enlarged mediastinal, hilar, or axillary lymph
nodes. Thyroid gland, trachea, and esophagus demonstrate no
significant findings.

Lungs/Pleura: No airspace disease, effusion, or pneumothorax. The
central airways are patent.

Upper Abdomen: No acute abnormality.

Musculoskeletal: No acute or destructive bony lesions. Reconstructed
images demonstrate no additional findings.

Review of the MIP images confirms the above findings.
IMPRESSION: 1. No evidence of pulmonary embolus.
2. No acute intrathoracic process.

## 2020-03-11 MED ORDER — LABETALOL HCL 200 MG PO TABS
200.0000 mg | ORAL_TABLET | Freq: Two times a day (BID) | ORAL | 1 refills | Status: DC
Start: 2020-03-12 — End: 2021-09-29

## 2020-03-11 MED ORDER — NIFEDIPINE 10 MG PO CAPS: 20.0000 mg | ORAL_CAPSULE | ORAL | Status: DC | PRN

## 2020-03-11 MED ORDER — NIFEDIPINE 10 MG PO CAPS: 10.0000 mg | ORAL_CAPSULE | ORAL | Status: DC | PRN

## 2020-03-11 MED ORDER — ZOLPIDEM TARTRATE 5 MG PO TABS: 5.0000 mg | ORAL_TABLET | Freq: Every evening | ORAL | Status: DC | PRN

## 2020-03-11 MED ORDER — PRENATAL MULTIVITAMIN CH: 1.0000 | ORAL_TABLET | Freq: Every day | ORAL | Status: DC

## 2020-03-11 MED ORDER — IOHEXOL 350 MG/ML SOLN
75.0000 mL | Freq: Once | INTRAVENOUS | Status: AC | PRN
  Administered 2020-03-11: 75 mL via INTRAVENOUS

## 2020-03-11 MED ORDER — LABETALOL HCL 100 MG PO TABS
ORAL_TABLET | ORAL | Status: AC
  Filled 2020-03-11: qty 1

## 2020-03-11 MED ORDER — LABETALOL HCL 100 MG PO TABS
200.0000 mg | ORAL_TABLET | Freq: Two times a day (BID) | ORAL | Status: DC
  Administered 2020-03-11: 200 mg via ORAL

## 2020-03-11 MED ORDER — LABETALOL HCL 100 MG PO TABS
ORAL_TABLET | ORAL | Status: AC
  Filled 2020-03-11: qty 2

## 2020-03-11 MED ORDER — ACETAMINOPHEN 325 MG PO TABS
650.0000 mg | ORAL_TABLET | ORAL | Status: DC | PRN
  Administered 2020-03-11: 650 mg via ORAL
  Filled 2020-03-11: qty 2

## 2020-03-11 MED ORDER — DOCUSATE SODIUM 100 MG PO CAPS: 100.0000 mg | ORAL_CAPSULE | Freq: Every day | ORAL | Status: DC

## 2020-03-11 MED ORDER — CALCIUM CARBONATE ANTACID 500 MG PO CHEW
2.0000 | CHEWABLE_TABLET | ORAL | Status: DC | PRN
  Administered 2020-03-11: 400 mg via ORAL
  Filled 2020-03-11 (×2): qty 2

## 2020-03-11 MED ORDER — LABETALOL HCL 5 MG/ML IV SOLN: 40.0000 mg | INTRAVENOUS | Status: DC | PRN

## 2020-03-11 NOTE — Progress Notes (Signed)
Pt to be d/c home and to self care. Pt given teaching on when to seek medical attention. Discussed medications. Pt verbalized understanding of d/c instructions.

## 2020-03-11 NOTE — Telephone Encounter (Signed)
Per pt scheduled today for BP check. Patient also has anxiety (could be reason for chest pain). Apt 2:10 w/RPH

## 2020-03-11 NOTE — Discharge Summary (Signed)
Physician Final Progress Note  Patient ID: Desiree Combs MRN: [id number] DOB/AGE: Apr 17, 1996 24 y.o.  Admit date: 03/11/2020 Admitting provider: Gae Dry, MD Discharge date: 03/11/2020   Admission Diagnoses: chest pain, elevated blood pressure in postpartum period  Discharge Diagnoses:  Active Problems:   Elevated blood pressure in postpartum period   Chest pain with normal EKG Normal CT scan  History of Present Illness: The patient is a 24 y.o. female G1P0001 at 2 days postpartum who presents from the office for chest discomfort that has been present for many days (preceding delivery too, even reports h/o anxiety based chest pains when not pregnant).  The patient is 2 days postpartum form NSVD w IOL for gest HTN, no magnesium needed or preeclampsia diagnosed.  Her BPs reported to normalized immediately after delivery.The established diagnosis for the patient is gestational hypertension.  She is currently on no antihypertensives.   She was admitted for observation, labs and imaging done, blood pressure monitored. All tests are within normal limits. Blood pressure continues to be labile. She denies headache, visual changes or epigastric pain. The patient is reassured by the results of testing and prefers to discharge to home. She is started on low dose of labetalol and has follow up in the office in 3 days. She is discharged to home with instructions and precautions.   Past Medical History:  Diagnosis Date  . Anxiety   . Hypertension     Past Surgical History:  Procedure Laterality Date  . OPEN REDUCTION INTERNAL FIXATION (ORIF) DISTAL RADIAL FRACTURE Left 03/03/2019   Procedure: OPEN REDUCTION INTERNAL FIXATION (ORIF) DISTAL RADIAL FRACTURE;  Surgeon: Hessie Knows, MD;  Location: [facility] ORS;  Service: Orthopedics;  Laterality: Left;    No current facility-administered medications on file prior to encounter.   Current Outpatient Medications on File Prior to Encounter   Medication Sig Dispense Refill  . ibuprofen (ADVIL) 600 MG tablet Take 1 tablet (600 mg total) by mouth every 6 (six) hours. 30 tablet 0  . PARoxetine (PAXIL) 10 MG tablet Take 1 tablet (10 mg total) by mouth daily. 30 tablet 2  . Prenatal Vit-DSS-Fe Fum-FA (PRENATAL 19) tablet Take 1 tablet by mouth daily.      No Known Allergies  Social History   Socioeconomic History  . Marital status: Married    Spouse name: Hart Carwin  . Number of children: Not on file  . Years of education: Not on file  . Highest education level: Not on file  Occupational History  . Not on file  Tobacco Use  . Smoking status: Never Smoker  . Smokeless tobacco: Never Used  Vaping Use  . Vaping Use: Never used  Substance and Sexual Activity  . Alcohol use: No  . Drug use: No  . Sexual activity: Yes    Birth control/protection: None  Other Topics Concern  . Not on file  Social History Narrative  . Not on file   Social Determinants of Health   Financial Resource Strain:   . Difficulty of Paying Living Expenses: Not on file  Food Insecurity:   . Worried About Charity fundraiser in the Last Year: Not on file  . Ran Out of Food in the Last Year: Not on file  Transportation Needs:   . Lack of Transportation (Medical): Not on file  . Lack of Transportation (Non-Medical): Not on file  Physical Activity:   . Days of Exercise per Week: Not on file  . Minutes of Exercise per Session:  Not on file  Stress:   . Feeling of Stress : Not on file  Social Connections:   . Frequency of Communication with Friends and Family: Not on file  . Frequency of Social Gatherings with Friends and Family: Not on file  . Attends Religious Services: Not on file  . Active Member of Clubs or Organizations: Not on file  . Attends Archivist Meetings: Not on file  . Marital Status: Not on file  Intimate Partner Violence:   . Fear of Current or Ex-Partner: Not on file  . Emotionally Abused: Not on file  . Physically  Abused: Not on file  . Sexually Abused: Not on file    Family History  Problem Relation Age of Onset  . Hypertension Mother   . Breast cancer Neg Hx   . Ovarian cancer Neg Hx      Review of Systems  Constitutional: Negative for chills and fever.  HENT: Negative for congestion, ear discharge, ear pain, hearing loss, sinus pain and sore throat.   Eyes: Negative for blurred vision and double vision.  Respiratory: Negative for cough, shortness of breath and wheezing.   Cardiovascular: Positive for chest pain. Negative for palpitations and leg swelling.  Gastrointestinal: Negative for abdominal pain, blood in stool, constipation, diarrhea, heartburn, melena, nausea and vomiting.  Genitourinary: Negative for dysuria, flank pain, frequency, hematuria and urgency.  Musculoskeletal: Negative for back pain, joint pain and myalgias.  Skin: Negative for itching and rash.  Neurological: Negative for dizziness, tingling, tremors, sensory change, speech change, focal weakness, seizures, loss of consciousness, weakness and headaches.  Endo/Heme/Allergies: Negative for environmental allergies. Does not bruise/bleed easily.  Psychiatric/Behavioral: Negative for depression, hallucinations, memory loss, substance abuse and suicidal ideas. The patient is not nervous/anxious and does not have insomnia.      Physical Exam: BP (!) 141/79   Pulse 78   Temp 98.8 F (37.1 C) (Oral)   Resp 14   Constitutional: Well nourished, well developed female in no acute distress.  HEENT: normal Skin: Warm and dry.  Cardiovascular: Regular rate and rhythm.   Extremity: no edema  Respiratory: Clear to auscultation bilateral. Normal respiratory effort Neuro: DTRs 2+, Cranial nerves grossly intact Psych: Alert and Oriented x3. No memory deficits. Normal mood and affect.  MS: normal gait, normal bilateral lower extremity ROM/strength/stability.   Consults: None  Significant Findings/ Diagnostic Studies: labs,  imaging, EKG Results for XENIA, NILE (MRN [id number]) as of 03/11/2020 18:54  Ref. Range 03/11/2020 15:15 03/11/2020 15:28  COMPREHENSIVE METABOLIC PANEL Unknown  Rpt (A)  Sodium Latest Ref Range: 135 - 145 mmol/L  138  Potassium Latest Ref Range: 3.5 - 5.1 mmol/L  3.5  Chloride Latest Ref Range: 98 - 111 mmol/L  107  CO2 Latest Ref Range: 22 - 32 mmol/L  22  Glucose Latest Ref Range: 70 - 99 mg/dL  82  BUN Latest Ref Range: 6 - 20 mg/dL  8  Creatinine Latest Ref Range: 0.44 - 1.00 mg/dL  0.56  Calcium Latest Ref Range: 8.9 - 10.3 mg/dL  8.1 (L)  Anion gap Latest Ref Range: 5 - 15   9  Alkaline Phosphatase Latest Ref Range: 38 - 126 U/L  68  Albumin Latest Ref Range: 3.5 - 5.0 g/dL  2.9 (L)  AST Latest Ref Range: 15 - 41 U/L  21  ALT Latest Ref Range: 0 - 44 U/L  14  Total Protein Latest Ref Range: 6.5 - 8.1 g/dL  6.3 (L)  Total Bilirubin Latest Ref Range: 0.3 - 1.2 mg/dL  0.4  GFR, Est Non African American Latest Ref Range: >60 mL/min  >60  GFR, Est African American Latest Ref Range: >60 mL/min  >60  Troponin I (High Sensitivity) Latest Ref Range: <18 ng/L  6  WBC Latest Ref Range: 4.0 - 10.5 K/uL  9.6  RBC Latest Ref Range: 3.87 - 5.11 MIL/uL  4.08  Hemoglobin Latest Ref Range: 12.0 - 15.0 g/dL  10.7 (L)  HCT Latest Ref Range: 36 - 46 %  32.8 (L)  MCV Latest Ref Range: 80.0 - 100.0 fL  80.4  MCH Latest Ref Range: 26.0 - 34.0 pg  26.2  MCHC Latest Ref Range: 30.0 - 36.0 g/dL  32.6  RDW Latest Ref Range: 11.5 - 15.5 %  14.9  Platelets Latest Ref Range: 150 - 400 K/uL  201  nRBC Latest Ref Range: 0.0 - 0.2 %  0.0  Sample Expiration Unknown  03/14/2020,2359...  Antibody Screen Unknown  NEG  ABO/RH(D) Unknown  B POS  Total Protein, Urine Latest Units: mg/dL 24   Protein Creatinine Ratio Latest Ref Range: 0.00 - 0.15 mg/mgCre 0.24 (H)   Creatinine, Urine Latest Units: mg/dL 99    CLINICAL DATA:  Sharp chest pain, 2 days postpartum  EXAM: CT ANGIOGRAPHY CHEST WITH  CONTRAST  TECHNIQUE: Multidetector CT imaging of the chest was performed using the standard protocol during bolus administration of intravenous contrast. Multiplanar CT image reconstructions and MIPs were obtained to evaluate the vascular anatomy.  CONTRAST:  61mL OMNIPAQUE IOHEXOL 350 MG/ML SOLN  COMPARISON:  None.  FINDINGS: Cardiovascular: This is a technically adequate evaluation of the pulmonary vasculature. No filling defects or pulmonary emboli. The heart is mildly enlarged, consistent with recent postpartum state. No pericardial effusion. Thoracic aorta is unremarkable.  Mediastinum/Nodes: No enlarged mediastinal, hilar, or axillary lymph nodes. Thyroid gland, trachea, and esophagus demonstrate no significant findings.  Lungs/Pleura: No airspace disease, effusion, or pneumothorax. The central airways are patent.  Upper Abdomen: No acute abnormality.  Musculoskeletal: No acute or destructive bony lesions. Reconstructed images demonstrate no additional findings.  Review of the MIP images confirms the above findings.  IMPRESSION: 1. No evidence of pulmonary embolus. 2. No acute intrathoracic process.  Electronically Signed   By: Randa Ngo M.D.   On: 03/11/2020 17:27  SHAYLYN, BAWA ID:[id number] 11-Mar-2020 17:59:36 [facility] ROUTINE RECORD Normal sinus rhythm Normal ECG No previous ECGs available 39mm/s 73mm/mV 100Hz  9.0.4 12SL 243 CID: [id number] Unconfirmed Vent. rate 71 BPM PR interval 144 ms QRS duration 82 ms QT/QTc 386/419 ms P-R-T axes 39 21 18 05/28/1996 (23 yr) Female Caucasian Room:OBS1 Loc:324 Technician: Test VQM:GQQPY pain  Procedures: none  Hospital Course: The patient was admitted to Labor and Delivery Triage for observation.   Discharge Condition: good  Disposition: Discharge disposition: 01-Home or Self Care  Diet: heart healthy diet  Discharge Activity: Activity as tolerated  Discharge  Instructions    Call MD for:  difficulty breathing, headache or visual disturbances   Complete by: As directed    Call MD for:  persistant dizziness or light-headedness   Complete by: As directed    Call MD for:  temperature >100.4   Complete by: As directed    Diet - low sodium heart healthy   Complete by: As directed    Increase activity slowly   Complete by: As directed      Allergies as of 03/11/2020   No Known Allergies  Medication List    TAKE these medications   ibuprofen 600 MG tablet Commonly known as: ADVIL Take 1 tablet (600 mg total) by mouth every 6 (six) hours.   labetalol 200 MG tablet Commonly known as: NORMODYNE Take 1 tablet (200 mg total) by mouth 2 (two) times daily. Start taking on: March 12, 2020   PARoxetine 10 MG tablet Commonly known as: Paxil Take 1 tablet (10 mg total) by mouth daily.   Prenatal 19 tablet Take 1 tablet by mouth daily.       [place]. Go to.   Specialty: Obstetrics and Gynecology Why: scheduled appointment Contact information: [address] [place] [address] [phone]              Total time spent taking care of this patient: 30 minutes  Signed: Rod Can, CNM  03/11/2020, 7:02 PM

## 2020-03-11 NOTE — Progress Notes (Signed)
Obstetrics & Gynecology Office Visit   Chief Complaint  Patient presents with  . Blood Pressure Check  . Chest Pain    History of Present Illness: 24 y.o. G1P0001 being seen for follow up blood pressure check today as well as chest discomfort that has been present for many days (preceding delivery too, even reports h/o anxiety based chest pains when not pregnant).  The patient is 2 days postpartum form NSVD w IOL for gest HTN, no magnesium needed or preeclampsia diagnosed.  Her BPs reported to normalized immediately after delivery.The established diagnosis for the patient is gestational hypertension.  She is currently on no antihypertensives.  She reports vision changes and chest pain.  Medication list reviewed medications which may contribute to BP elevation were not noted.  Past Medical History:  Past Medical History:  Diagnosis Date  . Anxiety     Past Surgical History:  Past Surgical History:  Procedure Laterality Date  . OPEN REDUCTION INTERNAL FIXATION (ORIF) DISTAL RADIAL FRACTURE Left 03/03/2019   Procedure: OPEN REDUCTION INTERNAL FIXATION (ORIF) DISTAL RADIAL FRACTURE;  Surgeon: Hessie Knows, MD;  Location: [facility] ORS;  Service: Orthopedics;  Laterality: Left;    Gynecologic History: No LMP recorded.  Obstetric History: G1P0001  Family History:  Family History  Problem Relation Age of Onset  . Hypertension Mother   . Breast cancer Neg Hx   . Ovarian cancer Neg Hx     Social History:  Social History   Socioeconomic History  . Marital status: Married    Spouse name: Hart Carwin  . Number of children: Not on file  . Years of education: Not on file  . Highest education level: Not on file  Occupational History  . Not on file  Tobacco Use  . Smoking status: Never Smoker  . Smokeless tobacco: Never Used  Vaping Use  . Vaping Use: Never used  Substance and Sexual Activity  . Alcohol use: No  . Drug use: No  . Sexual activity: Yes    Birth  control/protection: None  Other Topics Concern  . Not on file  Social History Narrative  . Not on file   Social Determinants of Health   Financial Resource Strain:   . Difficulty of Paying Living Expenses: Not on file  Food Insecurity:   . Worried About Charity fundraiser in the Last Year: Not on file  . Ran Out of Food in the Last Year: Not on file  Transportation Needs:   . Lack of Transportation (Medical): Not on file  . Lack of Transportation (Non-Medical): Not on file  Physical Activity:   . Days of Exercise per Week: Not on file  . Minutes of Exercise per Session: Not on file  Stress:   . Feeling of Stress : Not on file  Social Connections:   . Frequency of Communication with Friends and Family: Not on file  . Frequency of Social Gatherings with Friends and Family: Not on file  . Attends Religious Services: Not on file  . Active Member of Clubs or Organizations: Not on file  . Attends Archivist Meetings: Not on file  . Marital Status: Not on file  Intimate Partner Violence:   . Fear of Current or Ex-Partner: Not on file  . Emotionally Abused: Not on file  . Physically Abused: Not on file  . Sexually Abused: Not on file    Allergies:  No Known Allergies  Medications: Prior to Admission medications   Medication Sig  Start Date End Date Taking? Authorizing Provider  ibuprofen (ADVIL) 600 MG tablet Take 1 tablet (600 mg total) by mouth every 6 (six) hours. 03/10/20   [name], MD  PARoxetine (PAXIL) 10 MG tablet Take 1 tablet (10 mg total) by mouth daily. 03/10/20 03/10/21  [name], MD  Prenatal Vit-DSS-Fe Fum-FA (PRENATAL 19) tablet Take 1 tablet by mouth daily.    [provider]    Review of Systems  Cardiovascular: Positive for chest pain.  All other systems reviewed and are negative.   Physical Exam Blood pressure (!) 140/100, height 5\' 4"  (1.626 m), weight 221 lb (100.2 kg), unknown if currently breastfeeding. Repeat BP  again 140/100, >15 minutes later  No LMP recorded.  General: NAD HEENT: normocephalic, anicteric Pulmonary: No increased work of breathing Cardiovascular: RRR, distal pulses 2+ Extremities: 1+edema, no erythema, no tenderness Neurologic: Grossly intact Psychiatric: mood appropriate, affect full  Assessment: 24 y.o. G1P0001 presenting for blood pressure evaluation today  Plan: Problem List Items Addressed This Visit    None    Visit Diagnoses    Hypertension, postpartum condition or complication    -  Primary      1) Blood pressure - blood pressure at today's visit is elevated.  As a result the patient was sent to the L&D department for further evaluation. Consideration for preeclampsia discussed, with labs added and possible need for magnesium therapy.  - additional blood work will be obtained including:, CBC, CMP and P/C ratio  - Chest CT to evaluate for PE   - Breast feeding, allow for pumping or feeding  Barnett Applebaum, MD, [facility], [facility] 03/11/2020  2:46 PM

## 2020-03-11 NOTE — Telephone Encounter (Signed)
Patient reports she delivered Saturday 03/09/20. She's noticing chest pains, she noticed at hospital, but they went away w/advil/tyelnol. It's becoming more noticeable/painful. She took ibuprofen this morning and it doesn't seem to help. DG#[phone]

## 2020-03-11 NOTE — Telephone Encounter (Signed)
Spoke w/patient. She denies SOB/Difficulty breathing. Admits had reflux during pregnancy. Pain also unrelieved by tums. Describes as sharp pain on right side of chest below throat/sometimes in throat (it moves). Also mentioned she had high blood pressure during pregnancy and saw black spots while in shower yesterday.

## 2020-03-12 LAB — CERVICOVAGINAL ANCILLARY ONLY
Chlamydia: NEGATIVE
Comment: NEGATIVE
Comment: NEGATIVE
Comment: NORMAL
Neisseria Gonorrhea: NEGATIVE
Trichomonas: NEGATIVE

## 2020-03-14 ENCOUNTER — Encounter: Payer: Self-pay | Admitting: Obstetrics & Gynecology

## 2020-03-14 ENCOUNTER — Ambulatory Visit: Payer: BC Managed Care – PPO | Admitting: Obstetrics and Gynecology

## 2020-03-14 ENCOUNTER — Other Ambulatory Visit: Payer: Self-pay | Admitting: Obstetrics and Gynecology

## 2020-03-14 ENCOUNTER — Other Ambulatory Visit: Payer: Self-pay

## 2020-03-14 ENCOUNTER — Ambulatory Visit (INDEPENDENT_AMBULATORY_CARE_PROVIDER_SITE_OTHER): Payer: BC Managed Care – PPO | Admitting: Obstetrics & Gynecology

## 2020-03-14 VITALS — BP 130/88 | Ht 64.0 in | Wt 214.0 lb

## 2020-03-14 DIAGNOSIS — O133 Gestational [pregnancy-induced] hypertension without significant proteinuria, third trimester: Secondary | ICD-10-CM

## 2020-03-14 DIAGNOSIS — O99210 Obesity complicating pregnancy, unspecified trimester: Secondary | ICD-10-CM

## 2020-03-14 DIAGNOSIS — O165 Unspecified maternal hypertension, complicating the puerperium: Secondary | ICD-10-CM

## 2020-03-14 DIAGNOSIS — Z3A37 37 weeks gestation of pregnancy: Secondary | ICD-10-CM

## 2020-03-14 DIAGNOSIS — O365930 Maternal care for other known or suspected poor fetal growth, third trimester, not applicable or unspecified: Secondary | ICD-10-CM

## 2020-03-14 DIAGNOSIS — O0990 Supervision of high risk pregnancy, unspecified, unspecified trimester: Secondary | ICD-10-CM

## 2020-03-14 NOTE — Patient Instructions (Signed)
Thank you for choosing [facility]. As part of our ongoing efforts to improve patient experience, we would appreciate your feedback. Please fill out the short survey that you will receive by mail or MyChart. Your opinion is important to us! -Dr Kendan Cornforth  

## 2020-03-14 NOTE — Progress Notes (Signed)
Obstetrics & Gynecology Office Visit  CC: Hypertension post partum  History of Present Illness: 24 y.o. G1P0001 being seen for follow up blood pressure check today.  The patient is <1 week postpartumThe established diagnosis for the patient is gestational hypertension.  She is currently on labetalol 200mg .  She reports no current symptoms attributable to her blood pressure. The still has the chest discomfort reported earlier this week, and prior to delivery.  She was seen in hospital Monday w normal Chest CT and labwork for rule out preeclampsia.  No side effects to Labetalol.  Past Medical History:  Past Medical History:  Diagnosis Date  . Anxiety   . Hypertension     Past Surgical History:  Past Surgical History:  Procedure Laterality Date  . OPEN REDUCTION INTERNAL FIXATION (ORIF) DISTAL RADIAL FRACTURE Left 03/03/2019   Procedure: OPEN REDUCTION INTERNAL FIXATION (ORIF) DISTAL RADIAL FRACTURE;  Surgeon: Hessie Knows, MD;  Location: [facility] ORS;  Service: Orthopedics;  Laterality: Left;    Gynecologic History: No LMP recorded.  Obstetric History: G1P0001  Family History:  Family History  Problem Relation Age of Onset  . Hypertension Mother   . Breast cancer Neg Hx   . Ovarian cancer Neg Hx     Social History:  Social History   Socioeconomic History  . Marital status: Married    Spouse name: Hart Carwin  . Number of children: Not on file  . Years of education: Not on file  . Highest education level: Not on file  Occupational History  . Not on file  Tobacco Use  . Smoking status: Never Smoker  . Smokeless tobacco: Never Used  Vaping Use  . Vaping Use: Never used  Substance and Sexual Activity  . Alcohol use: No  . Drug use: No  . Sexual activity: Yes    Birth control/protection: None  Other Topics Concern  . Not on file  Social History Narrative  . Not on file   Social Determinants of Health   Financial Resource Strain:   . Difficulty of Paying Living  Expenses: Not on file  Food Insecurity:   . Worried About Charity fundraiser in the Last Year: Not on file  . Ran Out of Food in the Last Year: Not on file  Transportation Needs:   . Lack of Transportation (Medical): Not on file  . Lack of Transportation (Non-Medical): Not on file  Physical Activity:   . Days of Exercise per Week: Not on file  . Minutes of Exercise per Session: Not on file  Stress:   . Feeling of Stress : Not on file  Social Connections:   . Frequency of Communication with Friends and Family: Not on file  . Frequency of Social Gatherings with Friends and Family: Not on file  . Attends Religious Services: Not on file  . Active Member of Clubs or Organizations: Not on file  . Attends Archivist Meetings: Not on file  . Marital Status: Not on file  Intimate Partner Violence:   . Fear of Current or Ex-Partner: Not on file  . Emotionally Abused: Not on file  . Physically Abused: Not on file  . Sexually Abused: Not on file    Allergies:  No Known Allergies  Medications: Prior to Admission medications   Medication Sig Start Date End Date Taking? Authorizing Provider  labetalol (NORMODYNE) 200 MG tablet Take 1 tablet (200 mg total) by mouth 2 (two) times daily. 03/12/20  Yes Rod Can, CNM  PARoxetine (PAXIL) 10 MG tablet Take 1 tablet (10 mg total) by mouth daily. 03/10/20 03/10/21 Yes [name], MD  Prenatal Vit-DSS-Fe Fum-FA (PRENATAL 19) tablet Take 1 tablet by mouth daily.   Yes [provider]  ibuprofen (ADVIL) 600 MG tablet TAKE 1 TABLET(600 MG) BY MOUTH EVERY 6 HOURS 03/14/20   [name], MD    Review of Systems  Constitutional: Positive for malaise/fatigue.  Eyes: Negative for blurred vision.  Respiratory: Negative for shortness of breath.   Cardiovascular: Positive for chest pain. Negative for leg swelling.  Gastrointestinal: Negative for abdominal pain.  Neurological: Negative for headaches.  All other systems  reviewed and are negative.   Physical Exam Blood pressure 130/88, height 5\' 4"  (1.626 m), weight 214 lb (97.1 kg), unknown if currently breastfeeding.  General: NAD HEENT: normocephalic, anicteric Pulmonary: No increased work of breathing Cardiovascular: RRR, distal pulses 2+ Extremities: 1+edema, no erythema, no tenderness Neurologic: Grossly intact Psychiatric: mood appropriate, affect full  Assessment: 24 y.o. G1P0001 presenting for blood pressure evaluation today  Plan: Problem List Items Addressed This Visit    Hypertension, postpartum condition or complication    -  Primary      1) Blood pressure - blood pressure at today's visit is normotensive.  As a result - no change in therapy; cont Labetolol 200 mg BID; monitor for side effects; plan to discontinue at a certain time as do not antipate long term need for antihypertensive therapy. - additional blood work was not obtained today  2) Planning vasec for bc  3) Infant doing well  Barnett Applebaum, MD, [facility] 03/14/2020  9:31 AM

## 2020-03-23 ENCOUNTER — Other Ambulatory Visit: Payer: Self-pay | Admitting: Obstetrics and Gynecology

## 2020-03-23 DIAGNOSIS — Z3A37 37 weeks gestation of pregnancy: Secondary | ICD-10-CM

## 2020-03-23 DIAGNOSIS — O99210 Obesity complicating pregnancy, unspecified trimester: Secondary | ICD-10-CM

## 2020-03-23 DIAGNOSIS — O365930 Maternal care for other known or suspected poor fetal growth, third trimester, not applicable or unspecified: Secondary | ICD-10-CM

## 2020-03-23 DIAGNOSIS — O0990 Supervision of high risk pregnancy, unspecified, unspecified trimester: Secondary | ICD-10-CM

## 2020-03-23 DIAGNOSIS — O133 Gestational [pregnancy-induced] hypertension without significant proteinuria, third trimester: Secondary | ICD-10-CM

## 2020-03-27 ENCOUNTER — Ambulatory Visit: Payer: BC Managed Care – PPO

## 2020-03-27 ENCOUNTER — Ambulatory Visit: Payer: BC Managed Care – PPO | Admitting: Obstetrics & Gynecology

## 2020-03-27 ENCOUNTER — Other Ambulatory Visit: Payer: Self-pay | Admitting: Obstetrics & Gynecology

## 2020-03-27 ENCOUNTER — Other Ambulatory Visit: Payer: Self-pay

## 2020-03-27 VITALS — BP 126/80

## 2020-03-27 DIAGNOSIS — O165 Unspecified maternal hypertension, complicating the puerperium: Secondary | ICD-10-CM

## 2020-03-27 NOTE — Progress Notes (Signed)
BP and meds reviewed. No s/sx preeclampsia. BP 126/80 Will stop Labetalol and monitor for sx's or rise in BP  Barnett Applebaum, MD, [facility], [facility] 03/27/2020  3:23 PM

## 2020-03-27 NOTE — Progress Notes (Addendum)
Pt here for BP ck which was 126/80.  Pt wants to know when she needs to come back in or to just come at 6wk pp appt and does she continue the medication?  [phone]  Pt aware per PH to stop medication and sched 6wk pp appt.

## 2020-03-28 ENCOUNTER — Inpatient Hospital Stay: Admit: 2020-03-28 | Payer: Self-pay

## 2020-04-12 ENCOUNTER — Ambulatory Visit: Payer: BC Managed Care – PPO | Admitting: Obstetrics & Gynecology

## 2020-04-24 ENCOUNTER — Ambulatory Visit: Payer: BC Managed Care – PPO | Admitting: Obstetrics & Gynecology

## 2020-04-29 ENCOUNTER — Ambulatory Visit (INDEPENDENT_AMBULATORY_CARE_PROVIDER_SITE_OTHER): Payer: BC Managed Care – PPO | Admitting: Obstetrics and Gynecology

## 2020-04-29 ENCOUNTER — Encounter: Payer: Self-pay | Admitting: Obstetrics and Gynecology

## 2020-04-29 ENCOUNTER — Other Ambulatory Visit: Payer: Self-pay

## 2020-04-29 DIAGNOSIS — O1495 Unspecified pre-eclampsia, complicating the puerperium: Secondary | ICD-10-CM

## 2020-04-29 NOTE — Progress Notes (Signed)
Postpartum Visit   Chief Complaint  Patient presents with  . Postpartum Care   History of Present Illness: Patient is a 24 y.o. G1P0001 presents for postpartum visit.  Date of delivery: 03/09/2020 Type of delivery: Vaginal delivery - Vacuum or forceps assisted  no Episiotomy No.  Laceration: yes, 2nd degree  Pregnancy or labor problems:  Yes, FGR, GHTN Any problems since the delivery:  Yes, chest pain and ghtn requiring therapy  Newborn Details:  SINGLETON :  1. Baby's name: Wilber Oliphant. Birth weight: 2,450 grams (5.4 lbs) Maternal Details:  Breast Feeding:  yes Post partum depression/anxiety noted:  Mild. Doing well with Paxil [place] Post-Partum Depression Score:  9  Date of last PAP: 07/2019  normal   Past Medical History:  Diagnosis Date  . Anxiety   . Hypertension     Past Surgical History:  Procedure Laterality Date  . OPEN REDUCTION INTERNAL FIXATION (ORIF) DISTAL RADIAL FRACTURE Left 03/03/2019   Procedure: OPEN REDUCTION INTERNAL FIXATION (ORIF) DISTAL RADIAL FRACTURE;  Surgeon: Hessie Knows, MD;  Location: [facility] ORS;  Service: Orthopedics;  Laterality: Left;    Prior to Admission medications   Medication Sig Start Date End Date Taking? Authorizing Provider  PARoxetine (PAXIL) 10 MG tablet Take 1 tablet (10 mg total) by mouth daily. 03/10/20 03/10/21 Yes [name], MD  ibuprofen (ADVIL) 600 MG tablet TAKE 1 TABLET(600 MG) BY MOUTH EVERY 6 HOURS 03/23/20   [name], MD  Prenatal Vit-DSS-Fe Fum-FA (PRENATAL 19) tablet Take 1 tablet by mouth daily.    [provider]    No Known Allergies   Social History   Socioeconomic History  . Marital status: Married    Spouse name: Hart Carwin  . Number of children: Not on file  . Years of education: Not on file  . Highest education level: Not on file  Occupational History  . Not on file  Tobacco Use  . Smoking status: Never Smoker  . Smokeless tobacco: Never Used  Vaping Use  . Vaping Use:  Never used  Substance and Sexual Activity  . Alcohol use: No  . Drug use: No  . Sexual activity: Yes    Birth control/protection: None  Other Topics Concern  . Not on file  Social History Narrative  . Not on file   Social Determinants of Health   Financial Resource Strain:   . Difficulty of Paying Living Expenses: Not on file  Food Insecurity:   . Worried About Charity fundraiser in the Last Year: Not on file  . Ran Out of Food in the Last Year: Not on file  Transportation Needs:   . Lack of Transportation (Medical): Not on file  . Lack of Transportation (Non-Medical): Not on file  Physical Activity:   . Days of Exercise per Week: Not on file  . Minutes of Exercise per Session: Not on file  Stress:   . Feeling of Stress : Not on file  Social Connections:   . Frequency of Communication with Friends and Family: Not on file  . Frequency of Social Gatherings with Friends and Family: Not on file  . Attends Religious Services: Not on file  . Active Member of Clubs or Organizations: Not on file  . Attends Archivist Meetings: Not on file  . Marital Status: Not on file  Intimate Partner Violence:   . Fear of Current or Ex-Partner: Not on file  . Emotionally Abused: Not on file  . Physically Abused: Not on file  .  Sexually Abused: Not on file    Family History  Problem Relation Age of Onset  . Hypertension Mother   . Breast cancer Neg Hx   . Ovarian cancer Neg Hx     Review of Systems  Constitutional: Negative.   HENT: Negative.   Eyes: Negative.   Respiratory: Negative.   Cardiovascular: Negative.   Gastrointestinal: Negative.   Genitourinary: Negative.   Musculoskeletal: Negative.   Skin: Negative.   Neurological: Negative.   Psychiatric/Behavioral: Negative.      Physical Exam BP 120/80   Ht 5\' 4"  (1.626 m)   Wt 208 lb (94.3 kg)   BMI 35.70 kg/m   Physical Exam Constitutional:      General: She is not in acute distress.    Appearance: Normal  appearance. She is well-developed.  Genitourinary:     Pelvic exam was performed with patient in the lithotomy position.     Vulva, inguinal canal, urethra, bladder, vagina, uterus, right adnexa and left adnexa normal.     No posterior fourchette tenderness, injury or lesion present.     No cervical friability, lesion, bleeding or polyp.  HENT:     Head: Normocephalic and atraumatic.  Eyes:     General: No scleral icterus.    Conjunctiva/sclera: Conjunctivae normal.  Cardiovascular:     Rate and Rhythm: Normal rate and regular rhythm.     Heart sounds: No murmur heard.  No friction rub. No gallop.   Pulmonary:     Effort: Pulmonary effort is normal. No respiratory distress.     Breath sounds: Normal breath sounds. No wheezing or rales.  Abdominal:     General: Bowel sounds are normal. There is no distension.     Palpations: Abdomen is soft. There is no mass.     Tenderness: There is no abdominal tenderness. There is no guarding or rebound.  Musculoskeletal:        General: Normal range of motion.     Cervical back: Normal range of motion and neck supple.  Neurological:     General: No focal deficit present.     Mental Status: She is alert and oriented to person, place, and time.     Cranial Nerves: No cranial nerve deficit.  Skin:    General: Skin is warm and dry.     Findings: No erythema.  Psychiatric:        Mood and Affect: Mood normal.        Behavior: Behavior normal.        Judgment: Judgment normal.     Female Chaperone present during breast and/or pelvic exam.  Assessment: 24 y.o. G1P0001 presenting for 6 week postpartum visit  Plan: Problem List Items Addressed This Visit      Cardiovascular and Mediastinum   Preeclampsia in postpartum period    Other Visit Diagnoses    Postpartum care and examination    -  Primary     1) Contraception Education given regarding options for contraception, including plans vasectomy, condoms in the mean time.Marland Kitchen  2)  Pap -  ASCCP guidelines and rational discussed.  Patient opts for routine screening interval  3) Patient underwent screening for postpartum depression with some concerns noted.  Doing well currently on her current medication and dose  4) Follow up 1 year for routine annual exam  Prentice Docker, MD 04/29/2020 9:38 AM

## 2020-05-06 ENCOUNTER — Other Ambulatory Visit: Payer: Self-pay | Admitting: Obstetrics and Gynecology

## 2020-05-06 DIAGNOSIS — F419 Anxiety disorder, unspecified: Secondary | ICD-10-CM

## 2020-05-06 MED ORDER — PAROXETINE HCL 20 MG PO TABS: 20.0000 mg | ORAL_TABLET | Freq: Every day | ORAL | 2 refills | Status: DC

## 2020-05-06 NOTE — Telephone Encounter (Signed)
You delivered and saw you 11/8

## 2020-08-02 ENCOUNTER — Other Ambulatory Visit: Payer: Self-pay | Admitting: Obstetrics and Gynecology

## 2020-08-02 DIAGNOSIS — F419 Anxiety disorder, unspecified: Secondary | ICD-10-CM

## 2020-11-19 ENCOUNTER — Other Ambulatory Visit: Payer: Self-pay | Admitting: Obstetrics and Gynecology

## 2020-11-19 DIAGNOSIS — F419 Anxiety disorder, unspecified: Secondary | ICD-10-CM

## 2020-11-26 ENCOUNTER — Other Ambulatory Visit: Payer: Self-pay | Admitting: Obstetrics and Gynecology

## 2020-11-26 DIAGNOSIS — F419 Anxiety disorder, unspecified: Secondary | ICD-10-CM

## 2020-11-26 MED ORDER — PAROXETINE HCL 10 MG PO TABS: 10.0000 mg | ORAL_TABLET | Freq: Every day | ORAL | 0 refills | Status: AC

## 2021-03-04 ENCOUNTER — Other Ambulatory Visit: Payer: Self-pay | Admitting: Obstetrics and Gynecology

## 2021-03-04 DIAGNOSIS — F419 Anxiety disorder, unspecified: Secondary | ICD-10-CM

## 2021-04-30 ENCOUNTER — Ambulatory Visit: Payer: BC Managed Care – PPO | Admitting: Obstetrics & Gynecology

## 2021-06-28 ENCOUNTER — Emergency Department: Payer: BC Managed Care – PPO

## 2021-06-28 ENCOUNTER — Emergency Department
Admission: EM | Admit: 2021-06-28 | Discharge: 2021-06-29 | Disposition: A | Discharge status: Home / Self Care | Payer: BC Managed Care – PPO | Attending: Emergency Medicine | Admitting: Emergency Medicine

## 2021-06-28 ENCOUNTER — Other Ambulatory Visit: Payer: Self-pay

## 2021-06-28 DIAGNOSIS — R0602 Shortness of breath: Secondary | ICD-10-CM | POA: Insufficient documentation

## 2021-06-28 DIAGNOSIS — R002 Palpitations: Secondary | ICD-10-CM | POA: Insufficient documentation

## 2021-06-28 DIAGNOSIS — R0789 Other chest pain: Secondary | ICD-10-CM | POA: Insufficient documentation

## 2021-06-28 DIAGNOSIS — R079 Chest pain, unspecified: Secondary | ICD-10-CM | POA: Diagnosis present

## 2021-06-28 LAB — BASIC METABOLIC PANEL
Anion gap: 9 (ref 5–15)
BUN: 12 mg/dL (ref 6–20)
CO2: 24 mmol/L (ref 22–32)
Calcium: 9.4 mg/dL (ref 8.9–10.3)
Chloride: 105 mmol/L (ref 98–111)
Creatinine, Ser: 0.82 mg/dL (ref 0.44–1.00)
GFR, Estimated: >60 mL/min (ref >60)
Glucose, Bld: 116 mg/dL — ABNORMAL HIGH (ref 70–99)
Potassium: 3.4 mmol/L — ABNORMAL LOW (ref 3.5–5.1)
Sodium: 138 mmol/L (ref 135–145)

## 2021-06-28 LAB — POC URINE PREG, ED: Preg Test, Ur: NEGATIVE

## 2021-06-28 LAB — CBC
HCT: 43.5 % (ref 36.0–46.0)
Hemoglobin: 14.5 g/dL (ref 12.0–15.0)
MCH: 27.4 pg (ref 26.0–34.0)
MCHC: 33.3 g/dL (ref 30.0–36.0)
MCV: 82.2 fL (ref 80.0–100.0)
Platelets: 288 10*3/uL (ref 150–400)
RBC: 5.29 MIL/uL — ABNORMAL HIGH (ref 3.87–5.11)
RDW: 13.4 % (ref 11.5–15.5)
WBC: 8.8 10*3/uL (ref 4.0–10.5)
nRBC: 0 % (ref 0.0–0.2)

## 2021-06-28 LAB — TROPONIN I (HIGH SENSITIVITY): Troponin I (High Sensitivity): <2 ng/L (ref <18)

## 2021-06-28 IMAGING — CR DG CHEST 2V
2 series · 2 of 2 positions shown · non-contrast
Comparison: Chest CT 03/11/2020

CLINICAL DATA: Chest pain.  Shortness of breath.

EXAM:
CHEST - 2 VIEW

[chest pa]
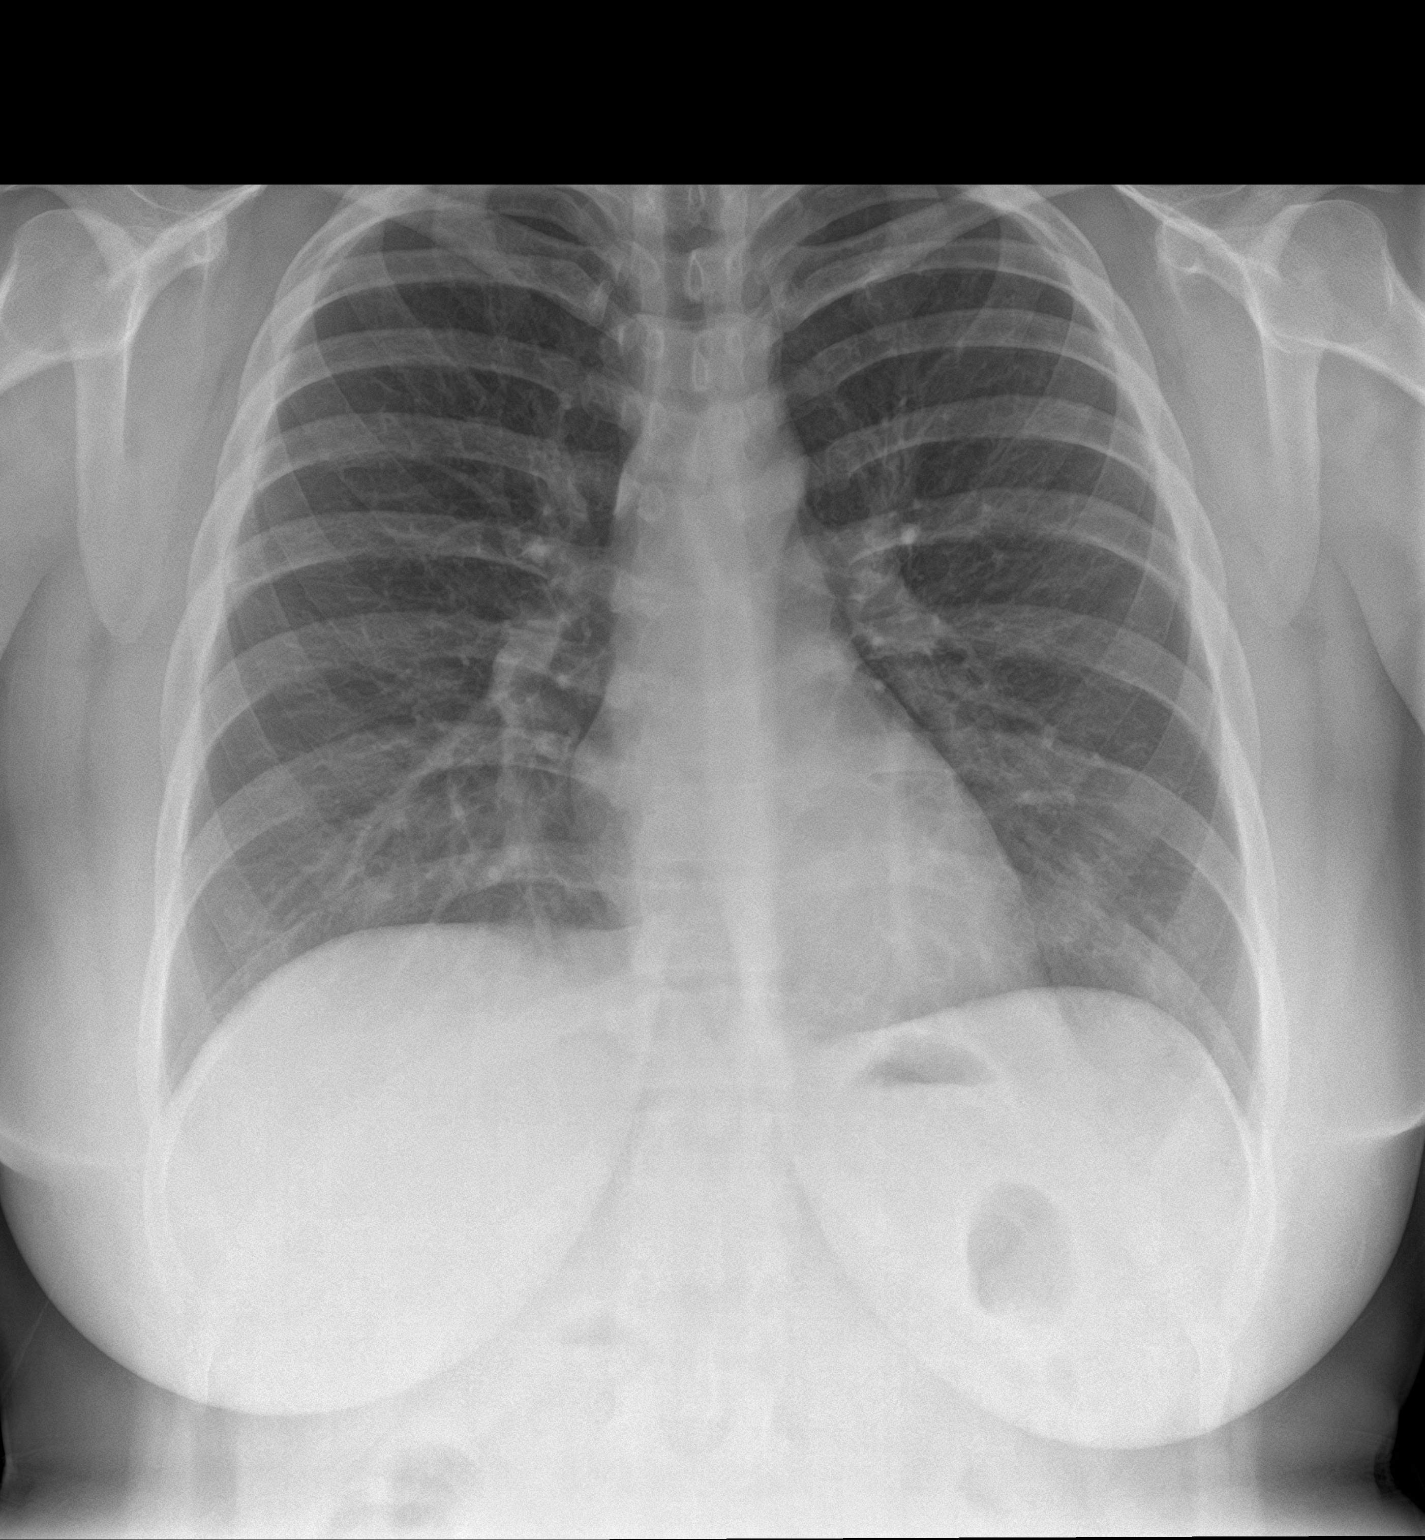

[chest lat]
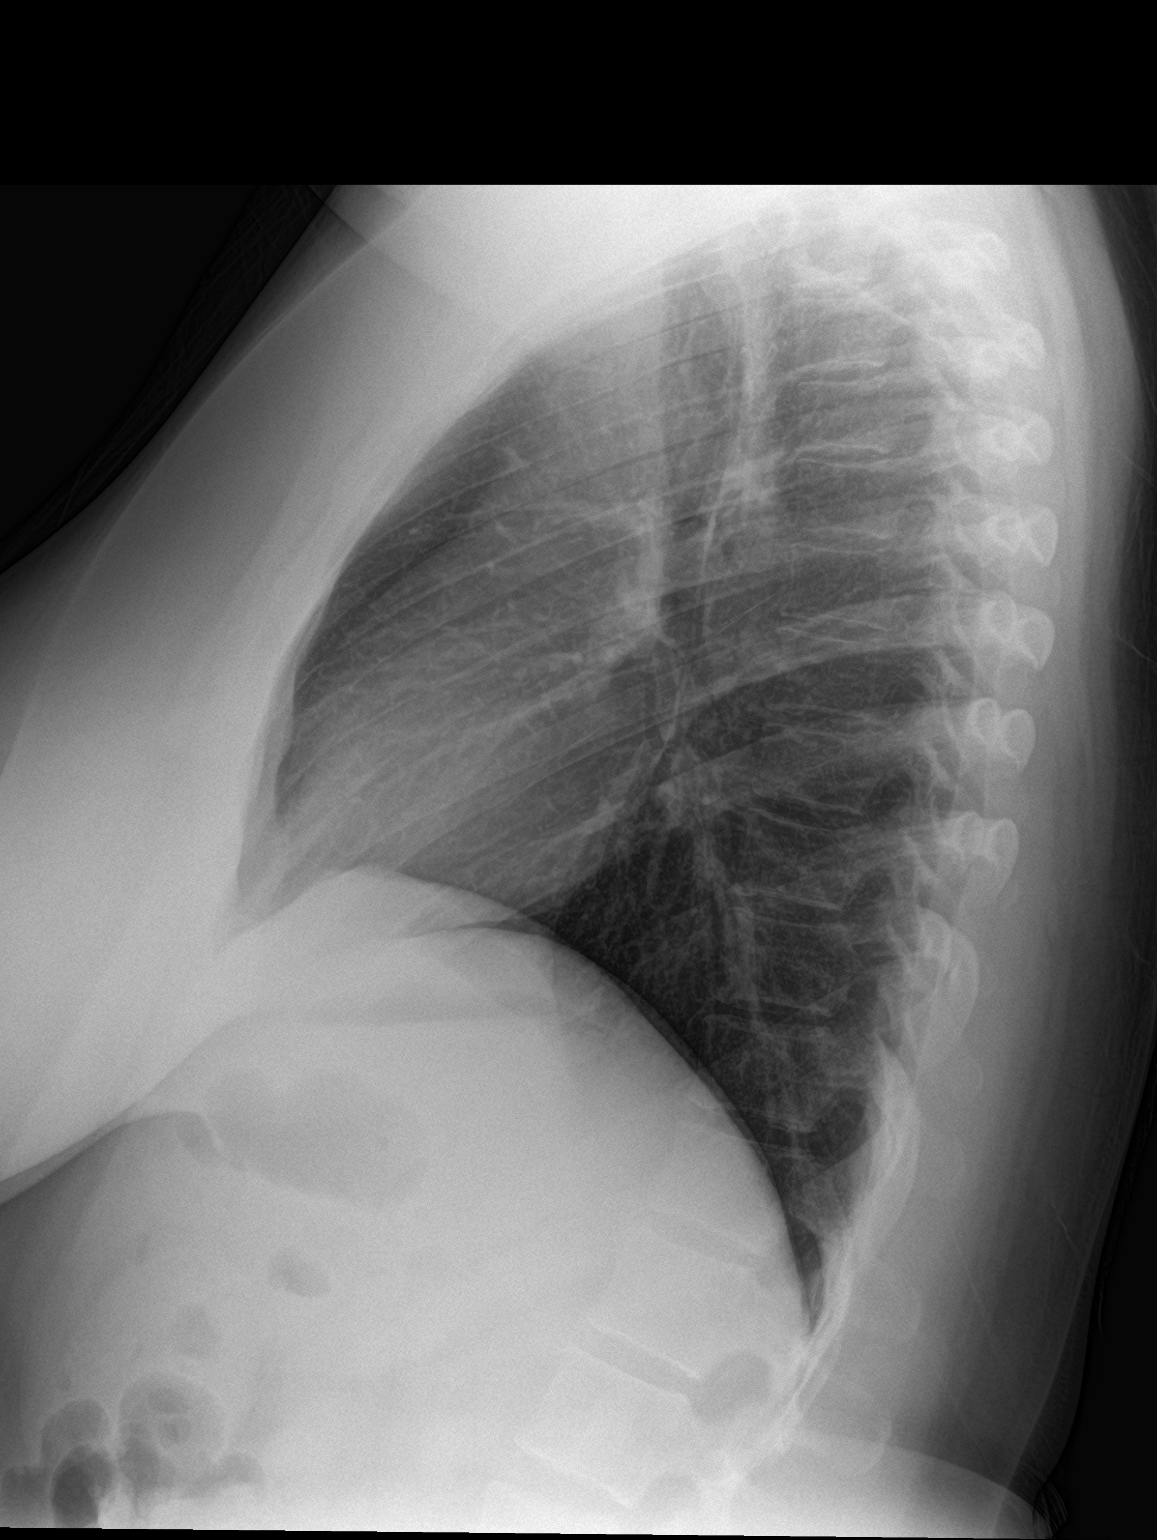

[2 of 2 positions shown; findings below may reference images not displayed]

FINDINGS: The cardiomediastinal contours are normal. The lungs are clear.
Pulmonary vasculature is normal. No consolidation, pleural effusion,
or pneumothorax. No acute osseous abnormalities are seen.
IMPRESSION: Negative radiographs of the chest.

## 2021-06-28 NOTE — ED Triage Notes (Signed)
Patient c/o medial chest pain radiating to back and left arm beginning yesterday. Patient reports accompanying symptom of SOB. Patient reports previous episode in the past with a sinus infection.

## 2021-06-29 NOTE — ED Provider Notes (Signed)
[facility] Provider Note    Event Date/Time   First MD Initiated Contact with Patient 06/28/21 2357     (approximate)   History   Chest Pain   HPI  Desiree Combs is a 26 y.o. female whose medical history includes "chest pain with normal EKG" including a prior CT angio chest in September 2021, as well as a history of anxiety.  She presents for evaluation tonight of episode of sharp chest pain that occurred yesterday and then some persistent aching pain in her chest with certain shortness of breath.  She said that her Apple Watch told her her heart rate was as high as 140 earlier today.  Sometimes she feels palpitations.  She is not sure if stress or anxiety is contributing to her symptoms after the initial chest pain because she has not had any chest pain since that time 1 or 2 days ago.  She denies fever, nausea, vomiting, abdominal pain, and dysuria.  She is not currently taking birth control.  She went on a trip last month but has not been on a trip since then.     Physical Exam   Triage Vital Signs: ED Triage Vitals  Enc Vitals Group     BP 06/28/21 1614 131/81     Pulse Rate 06/28/21 1614 (!) 104     Resp 06/28/21 1614 17     Temp 06/28/21 1614 99.5 F (37.5 C)     Temp Source 06/28/21 2024 Oral     SpO2 06/28/21 1614 96 %     Weight 06/28/21 1615 94.3 kg (207 lb 14.3 oz)     Height 06/28/21 1615 1.626 m (5\' 4" )     Head Circumference --      Peak Flow --      Pain Score 06/28/21 1614 2     Pain Loc --      Pain Edu? --      Excl. in [place]? --     Most recent vital signs: Vitals:   06/28/21 2024 06/28/21 2212  BP: (!) 145/82 (!) 147/83  Pulse: 98 94  Resp: 18 16  Temp: 99.3 F (37.4 C)   SpO2: 98% 100%     General: Awake, no distress.  CV:  Good peripheral perfusion.  Heart rate has normalized to the 90s. Resp:  Normal effort.  Abd:  No distention.  Other:  No focal neurological deficits.   ED Results / Procedures / Treatments    Labs (all labs ordered are listed, but only abnormal results are displayed) Labs Reviewed  BASIC METABOLIC PANEL - Abnormal; Notable for the following components:      Result Value   Potassium 3.4 (*)    Glucose, Bld 116 (*)    All other components within normal limits  CBC - Abnormal; Notable for the following components:   RBC 5.29 (*)    All other components within normal limits  POC URINE PREG, ED  TROPONIN I (HIGH SENSITIVITY)  TROPONIN I (HIGH SENSITIVITY)     EKG  ED ECG REPORT I, Hinda Kehr, the attending physician, personally viewed and interpreted this ECG.  Date: 06/28/2021 EKG Time: 15: 55 Rate: 107 Rhythm: Sinus tachycardia QRS Axis: normal Intervals: normal ST/T Wave abnormalities: Non-specific ST segment / T-wave changes, but no clear evidence of acute ischemia. Narrative Interpretation: no definitive evidence of acute ischemia; does not meet STEMI criteria.    RADIOLOGY Personally reviewed the patient's chest x-ray and there is  no evidence of any acute abnormality.  The radiology report agrees with this assessment.    PROCEDURES:  Critical Care performed: No  Procedures   MEDICATIONS ORDERED IN ED: Medications - No data to display   IMPRESSION / MDM / [place] / ED COURSE  I reviewed the triage vital signs and the nursing notes.                              Differential diagnosis includes, but is not limited to, musculoskeletal pain, viral illness, costochondritis, ACS, PE, pneumonia, atypical biliary colic presentation.  Patient's overall presentation is reassuring.  Although she initially had some tachycardia, her heart rate has resolved and she no longer is tachycardic.  She is PERC negative during the time that I evaluated her.  She has been evaluated previously for possible PE; I reviewed her results from a CTA chest obtained on 03/11/2020 that was negative for PE.  Her EKG shows no evidence of ischemia.  Labs ordered for  this visit include high-sensitivity troponin, CBC, basic metabolic panel, and urine pregnancy test.  I reviewed the results and her CBC is within normal limits, high-sensitivity troponin is normal in the setting of no chest pain in 1 to 2 days, and a basic metabolic panel is normal other than a slightly decreased potassium level which is not clinically significant.  She has no tenderness to palpation of the abdomen including the epigastrium and right upper quadrant with a negative Murphy sign.  I do not believe this is biliary.  Given that she is PERC negative, has had a work-up for chest pain in the past including a CTA which was negative, and no sign of ischemia on EKG, I believe that the patient is safe with no evidence of an acute or emergent medical condition or cause of her chest pain.  I considered admission for chest pain or consider additional evaluation including CTA, but I think that her risk of PE is so low given all the information described above that the long-term risk of radiation is greater than the benefit, and admission is not necessary given her very low risk of ACS and of any morbidity/mortality associated with her symptoms.  I discussed her results with her and provided reassurance, and she is comfortable with the plan for discharge and outpatient follow-up.  I gave my usual and customary return precautions.         FINAL CLINICAL IMPRESSION(S) / ED DIAGNOSES   Final diagnoses:  Atypical chest pain  Palpitations     Rx / DC Orders   ED Discharge Orders     None        Note:  This document was prepared using Dragon voice recognition software and may include unintentional dictation errors.   Hinda Kehr, MD 06/29/21 [id number]

## 2021-06-29 NOTE — Discharge Instructions (Addendum)
Your workup in the Emergency Department today was reassuring.  We did not find any specific abnormalities.  We recommend you drink plenty of fluids, take your regular medications and/or any new ones prescribed today, and follow up with the doctor(s) listed in these documents as recommended.  Return to the Emergency Department if you develop new or worsening symptoms that concern you.  

## 2021-06-29 NOTE — ED Notes (Signed)
Patient reports no current chest pain

## 2021-07-17 ENCOUNTER — Ambulatory Visit: Payer: Self-pay | Admitting: Obstetrics and Gynecology

## 2021-09-29 ENCOUNTER — Ambulatory Visit (INDEPENDENT_AMBULATORY_CARE_PROVIDER_SITE_OTHER): Payer: BC Managed Care – PPO | Admitting: Obstetrics and Gynecology

## 2021-09-29 ENCOUNTER — Encounter: Payer: Self-pay | Admitting: Obstetrics and Gynecology

## 2021-09-29 VITALS — BP 128/70 | Ht 64.0 in | Wt 215.0 lb

## 2021-09-29 DIAGNOSIS — Z3169 Encounter for other general counseling and advice on procreation: Secondary | ICD-10-CM

## 2021-09-29 DIAGNOSIS — F419 Anxiety disorder, unspecified: Secondary | ICD-10-CM | POA: Diagnosis not present

## 2021-09-29 DIAGNOSIS — Z01419 Encounter for gynecological examination (general) (routine) without abnormal findings: Secondary | ICD-10-CM

## 2021-09-29 NOTE — Patient Instructions (Signed)
I value your feedback and you entrusting us with your care. If you get a [facility] patient survey, I would appreciate you taking the time to let us know about your experience today. Thank you! ? ? ?

## 2021-09-29 NOTE — Progress Notes (Signed)
? ?PCP:  Sallee Lange, NP ? ? ?Chief Complaint  ?Patient presents with  ? Gynecologic Exam  ?  No concerns  ? ? ? ?HPI: ?     Ms. Desiree Combs is a 26 y.o. G0P0000 who LMP was Patient's last menstrual period was 08/31/2021 (exact date)., presents today for her annual examination.  Her menses are regular every 4 wks, lasting 7 days.  Dysmenorrhea none. She does not have intermenstrual bleeding. ? ?Sex activity: single partner, contraception - condoms. Conception ok. Taking MVI.  ?Last Pap: 08/02/19 Results were: no abnormalities  ?Hx of STDs: none ? ?There is no FH of breast cancer. There is no FH of ovarian cancer. The patient occas does self-breast exams. ? ?Tobacco use: The patient denies current or previous tobacco use. ?Alcohol use: social drinker ?No drug use.  ?Exercise: mod active ? ?She does get adequate calcium but not Vitamin D in her diet. ? ?She was taking paxil for anxiety but stopped it. Doing ashwagandha now and thinks it's helping.  ? ?Patient Active Problem List  ? Diagnosis Date Noted  ? Preeclampsia in postpartum period 03/11/2020  ? Chest pain with normal EKG 03/11/2020  ? Anxiety   ? ? ?Past Surgical History:  ?Procedure Laterality Date  ? OPEN REDUCTION INTERNAL FIXATION (ORIF) DISTAL RADIAL FRACTURE Left 03/03/2019  ? Procedure: OPEN REDUCTION INTERNAL FIXATION (ORIF) DISTAL RADIAL FRACTURE;  Surgeon: Hessie Knows, MD;  Location: [facility] ORS;  Service: Orthopedics;  Laterality: Left;  ? ? ?Family History  ?Problem Relation Age of Onset  ? Other Mother   ?     High Cholesterol  ? Skin cancer Maternal Grandfather   ? Breast cancer Neg Hx   ? Ovarian cancer Neg Hx   ? ? ?Social History  ? ?Socioeconomic History  ? Marital status: Married  ?  Spouse name: Hart Carwin  ? Number of children: Not on file  ? Years of education: Not on file  ? Highest education level: Not on file  ?Occupational History  ? Not on file  ?Tobacco Use  ? Smoking status: Never  ? Smokeless tobacco: Never  ?Vaping Use   ? Vaping Use: Never used  ?Substance and Sexual Activity  ? Alcohol use: No  ? Drug use: No  ? Sexual activity: Yes  ?  Birth control/protection: None, Condom  ?Other Topics Concern  ? Not on file  ?Social History Narrative  ? Not on file  ? ?Social Determinants of Health  ? ?Financial Resource Strain: Not on file  ?Food Insecurity: Not on file  ?Transportation Needs: Not on file  ?Physical Activity: Not on file  ?Stress: Not on file  ?Social Connections: Not on file  ?Intimate Partner Violence: Not on file  ? ? ? ?Current Outpatient Medications:  ?  NIFEdipine (PROCARDIA-XL/NIFEDICAL-XL) 30 MG 24 hr tablet, Take 30 mg by mouth daily., Disp: , Rfl:  ?  PARoxetine (PAXIL) 10 MG tablet, Take 1 tablet (10 mg total) by mouth daily., Disp: 90 tablet, Rfl: 0 ? ? ? ?ROS: ? ?Review of Systems  ?Constitutional:  Negative for fatigue, fever and unexpected weight change.  ?Respiratory:  Negative for cough, shortness of breath and wheezing.   ?Cardiovascular:  Negative for chest pain, palpitations and leg swelling.  ?Gastrointestinal:  Negative for blood in stool, constipation, diarrhea, nausea and vomiting.  ?Endocrine: Negative for cold intolerance, heat intolerance and polyuria.  ?Genitourinary:  Negative for dyspareunia, dysuria, flank pain, frequency, genital sores, hematuria, menstrual problem, pelvic  pain, urgency, vaginal bleeding, vaginal discharge and vaginal pain.  ?Musculoskeletal:  Negative for back pain, joint swelling and myalgias.  ?Skin:  Negative for rash.  ?Neurological:  Negative for dizziness, syncope, light-headedness, numbness and headaches.  ?Hematological:  Negative for adenopathy.  ?Psychiatric/Behavioral:  Positive for agitation. Negative for confusion, sleep disturbance and suicidal ideas. The patient is not nervous/anxious.   ?BREAST: No symptoms ? ? ?Objective: ?BP 128/70   Ht '5\' 4"'$  (1.626 m)   Wt 215 lb (97.5 kg)   LMP 08/31/2021 (Exact Date)   Breastfeeding No   BMI 36.90 kg/m?   ? ? ?Physical Exam ?Constitutional:   ?   Appearance: She is well-developed.  ?Genitourinary:  ?   Vulva normal.  ?   Right Labia: No rash, tenderness or lesions. ?   Left Labia: No tenderness, lesions or rash. ?   No vaginal discharge, erythema or tenderness.  ? ?   Right Adnexa: not tender and no mass present. ?   Left Adnexa: not tender and no mass present. ?   No cervical friability or polyp.  ?   Uterus is not enlarged or tender.  ?Breasts: ?   Right: No mass, nipple discharge, skin change or tenderness.  ?   Left: No mass, nipple discharge, skin change or tenderness.  ?Neck:  ?   Thyroid: No thyromegaly.  ?Cardiovascular:  ?   Rate and Rhythm: Normal rate and regular rhythm.  ?   Heart sounds: Normal heart sounds. No murmur heard. ?Pulmonary:  ?   Effort: Pulmonary effort is normal.  ?   Breath sounds: Normal breath sounds.  ?Abdominal:  ?   Palpations: Abdomen is soft.  ?   Tenderness: There is no abdominal tenderness. There is no guarding or rebound.  ?Musculoskeletal:     ?   General: Normal range of motion.  ?   Cervical back: Normal range of motion.  ?Lymphadenopathy:  ?   Cervical: No cervical adenopathy.  ?Neurological:  ?   General: No focal deficit present.  ?   Mental Status: She is alert and oriented to person, place, and time.  ?   Cranial Nerves: No cranial nerve deficit.  ?Skin: ?   General: Skin is warm and dry.  ?Psychiatric:     ?   Mood and Affect: Mood normal.     ?   Behavior: Behavior normal.     ?   Thought Content: Thought content normal.     ?   Judgment: Judgment normal.  ?Vitals reviewed.  ?  ? ?Assessment/Plan: ?Encounter for annual routine gynecological examination ? ?Anxiety--declines paxil for now. F/u prn.  ? ?Pre-conception counseling--cont MVI. F/u prn NOB. Check with pharm to see if ashwangandha safe to take  ? ?  ?GYN counsel adequate intake of calcium and vitamin D, diet and exercise ? ? ?  F/U ? Return in about 1 year (around 09/30/2022). ? ?Brittaney Beaulieu B. Tanelle Lanzo,  PA-C ?09/29/2021 ?4:09 PM ?

## 2021-11-27 ENCOUNTER — Encounter: Payer: Self-pay | Admitting: Obstetrics and Gynecology

## 2023-03-10 ENCOUNTER — Ambulatory Visit: Payer: BC Managed Care – PPO | Admitting: Obstetrics

## 2023-03-30 NOTE — Progress Notes (Deleted)
    GYNECOLOGY PROGRESS NOTE  Subjective:    Patient ID: Desiree Combs, female    DOB: 1995-12-30, 27 y.o.   MRN: [id number]  HPI  Patient is a 27 y.o. G26P1001 female who presents for missed periods with negative pregnancy test.   {Common ambulatory SmartLinks:19316}  Review of Systems {ros; complete:30496}   Objective:   There were no vitals taken for this visit. There is no height or weight on file to calculate BMI. General appearance: {general exam:16600} Abdomen: {abdominal exam:16834} Pelvic: {pelvic exam:16852::"cervix normal in appearance","external genitalia normal","no adnexal masses or tenderness","no cervical motion tenderness","rectovaginal septum normal","uterus normal size, shape, and consistency","vagina normal without discharge"} Extremities: {extremity exam:5109} Neurologic: {neuro exam:17854}   Assessment:   No diagnosis found.   Plan:   There are no diagnoses linked to this encounter.

## 2023-03-31 ENCOUNTER — Ambulatory Visit: Payer: BC Managed Care – PPO | Admitting: Obstetrics

## 2023-03-31 DIAGNOSIS — N926 Irregular menstruation, unspecified: Secondary | ICD-10-CM

## 2023-04-30 NOTE — Progress Notes (Unsigned)
GYNECOLOGY: ANNUAL EXAM   Subjective:    PCP: Myrene Buddy, NP Desiree Combs is a 27 y.o. female G1P1001 who presents for annual wellness visit.   Well Woman Visit:  GYN HISTORY:  Patient's last menstrual period was 04/25/2023.     Menstrual History: OB History     Gravida  1   Para  1   Term  1   Preterm  0   AB  0   Living  1      SAB  0   IAB  0   Ectopic  0   Multiple  0   Live Births  1           Menarche age: 59 Patient's last menstrual period was 04/25/2023. Had no cycle from May to Sep 13th of 2024.  Period Cycle (Days): 30 Period Duration (Days): 7 Period Pattern: Regular Menstrual Flow: Moderate Dysmenorrhea: None Uses pads  and changes it every 6 hours.  Cyclic symptoms include:  no .  Intermenstrual bleeding, spotting, or discharge? no Urinary incontinence? yes  Sexually active: yes Number of sexual partners: 1 Gender of sexual Partners: Male Social History   Substance and Sexual Activity  Sexual Activity Yes   Birth control/protection: None, Condom   Dyspareunia? No STI history: no STI/HIV testing or immunizations needed? No.   Health Maintenance: -Last pap: was normal 08/02/19 NILM --> Any abnormals: No -Last mammogram: N/A --> Any abnormals? N/A -Last colon cancer screen: N/A / Type: N/A -Last DEXA scan: NO -FMH of Breast / Colon / Cervical cancer: NO -Vaccines:  Immunization History  Administered Date(s) Administered   Influenza,inj,Quad PF,6+ Mos 08/02/2019, 02/23/2020   Moderna Sars-Covid-2 Vaccination 05/03/2020, 05/31/2020   Tdap 01/19/2020   Last Tdap: 01/19/20 / Flu: today / COVID: 05/31/20 / Gardasil: Completed  / Shingles (50+): no / PCV20: no -Hep C screen: completed  -Last lipid / glucose screening: 06/10/22  > Exercise: none, moderately active > Dietary Supplements: Folate: No;  Calcium: No; Vitamin D: No > Body mass index is 38.28 kg/m.  > Recent dental visit Yes.   > Seat Belt Use:  Yes.   > Texting and driving? No. > Guns in the house Yes.   > Recreational or other drug use: denied.   Social History   Tobacco Use   Smoking status: Never   Smokeless tobacco: Never  Substance Use Topics   Alcohol use: No   Occupation: Runner, broadcasting/film/video    Lives with: husband    PHQ-2 Score: In last two weeks, how often have you felt: Little interest or pleasure in doing things: Not at all (0) Feeling down, depressed or hopeless: Not at all (0) Score:   GAD-2 Over the last 2 weeks, how often have you been bothered by the following problems? Feeling nervous, anxious or on edge: Several days (+1) Not being able to stop or control worrying: Several days (+1)} Score: 2 _________________________________________________________  Current Outpatient Medications  Medication Sig Dispense Refill   NIFEdipine (PROCARDIA-XL/NIFEDICAL-XL) 30 MG 24 hr tablet Take 30 mg by mouth daily.     PARoxetine (PAXIL) 20 MG tablet Take by mouth.     PARoxetine (PAXIL) 10 MG tablet Take 1 tablet (10 mg total) by mouth daily. 90 tablet 0   No current facility-administered medications for this visit.   No Known Allergies  Past Medical History:  Diagnosis Date   Anxiety    Hypertension    Past Surgical History:  Procedure Laterality  Date   OPEN REDUCTION INTERNAL FIXATION (ORIF) DISTAL RADIAL FRACTURE Left 03/03/2019   Procedure: OPEN REDUCTION INTERNAL FIXATION (ORIF) DISTAL RADIAL FRACTURE;  Surgeon: Kennedy Bucker, MD;  Location: [facility];  Service: Orthopedics;  Laterality: Left;   Review Of Systems  Constitutional: Denied constitutional symptoms, night sweats, recent illness, fatigue, fever, insomnia and weight loss.  Eyes: Denied eye symptoms, eye pain, photophobia, vision change and visual disturbance.  Ears/Nose/Throat/Neck: Denied ear, nose, throat or neck symptoms, hearing loss, nasal discharge, sinus congestion and sore throat.  Cardiovascular: Denied cardiovascular symptoms, arrhythmia,  chest pain/pressure, edema, exercise intolerance, orthopnea and palpitations.  Respiratory: Denied pulmonary symptoms, asthma, pleuritic pain, productive sputum, cough, dyspnea and wheezing.  Gastrointestinal: Denied, gastro-esophageal reflux, melena, nausea and vomiting.  Genitourinary: Denied genitourinary symptoms including symptomatic vaginal discharge, pelvic relaxation issues, and urinary complaints.  Musculoskeletal: Denied musculoskeletal symptoms, stiffness, swelling, muscle weakness and myalgia.  Dermatologic: Denied dermatology symptoms, rash and scar.  Neurologic: Denied neurology symptoms, dizziness, headache, neck pain and syncope.  Psychiatric: Denied psychiatric symptoms, anxiety and depression.  Endocrine: Denied endocrine symptoms including hot flashes and night sweats.      Objective:    BP 108/77   Pulse 71   Ht 5\' 4"  (1.626 m)   Wt 223 lb (101.2 kg)   LMP 04/25/2023   BMI 38.28 kg/m   Constitutional: Well-developed, well-nourished female in no acute distress Neurological: Alert and oriented to person, place, and time Psychiatric: Mood and affect appropriate Skin: No rashes or lesions Neck: Supple without masses. Trachea is midline.Thyroid is normal size without masses Lymphatics: No cervical, axillary, supraclavicular, or inguinal adenopathy noted Respiratory: Clear to auscultation bilaterally. Good air movement with normal work of breathing. Cardiovascular: Regular rate and rhythm. Extremities grossly normal, nontender with no edema; pulses regular Gastrointestinal: Soft, nontender, nondistended. No masses or hernias appreciated. No hepatosplenomegaly. No fluid wave. No rebound or guarding. Breast Exam: normal appearance, no masses or tenderness, Inspection negative, No nipple retraction or dimpling, No nipple discharge or bleeding, No axillary or supraclavicular adenopathy, Normal to palpation without dominant masses Genitourinary:         External Genitalia:  Normal female genitalia    Vagina: Normal mucosa, no lesions.    Cervix: No lesions, normal size and consistency; no cervical motion tenderness; non-friable; Pap obtained.    Uterus: Normal size and contour; smooth, mobile, NT, retroverted. Adnexae: Non-palpable and non-tender Perineum/Anus: No lesions Rectal: deferred    Assessment/Plan:    Desiree Combs is a 27 y.o. female G61P1001 with normal well-woman gynecologic exam.  -Screenings:  Pap: done w/rflx today Labs: A1C, HepC, Lipid panel, Vit D, TSH GAD2 = 2, follow up with PCP if worsens or develops concern -Contraception: none, declines -Vaccines: Flu today; Gardasil completed and Tdap UTD, due 2031 -Healthy lifestyle modifications discussed: multivitamin, diet, exercise, sunscreen, tobacco and alcohol use. Emphasized importance of regular physical activity.  -Folate recommendation reviewed.  -All questions answered to patient's satisfaction.   Oligomenorrhea: missed periods from May to Sep '24, also Oct '24.  -Declines contraception today to help regulate.  -Check labs and pelvic US, will call with results.    Return in about 1 year (around 05/02/2024) for Annual. Sooner prn.    Julieanne Manson, DO [facility] at [facility]

## 2023-05-03 ENCOUNTER — Encounter: Payer: Self-pay | Admitting: Obstetrics

## 2023-05-03 ENCOUNTER — Ambulatory Visit (INDEPENDENT_AMBULATORY_CARE_PROVIDER_SITE_OTHER): Payer: BC Managed Care – PPO | Admitting: Obstetrics

## 2023-05-03 ENCOUNTER — Other Ambulatory Visit (HOSPITAL_COMMUNITY)
Admission: RE | Admit: 2023-05-03 | Discharge: 2023-05-03 | Disposition: A | Discharge status: Home / Self Care | Payer: BC Managed Care – PPO | Source: Ambulatory Visit | Attending: Obstetrics | Admitting: Obstetrics

## 2023-05-03 VITALS — BP 108/77 | HR 71 | Ht 64.0 in | Wt 223.0 lb

## 2023-05-03 DIAGNOSIS — Z124 Encounter for screening for malignant neoplasm of cervix: Secondary | ICD-10-CM

## 2023-05-03 DIAGNOSIS — Z01419 Encounter for gynecological examination (general) (routine) without abnormal findings: Secondary | ICD-10-CM | POA: Diagnosis present

## 2023-05-03 DIAGNOSIS — Z6838 Body mass index (BMI) 38.0-38.9, adult: Secondary | ICD-10-CM

## 2023-05-03 DIAGNOSIS — Z1159 Encounter for screening for other viral diseases: Secondary | ICD-10-CM

## 2023-05-03 DIAGNOSIS — N915 Oligomenorrhea, unspecified: Secondary | ICD-10-CM

## 2023-05-03 DIAGNOSIS — Z23 Encounter for immunization: Secondary | ICD-10-CM

## 2023-05-04 ENCOUNTER — Encounter: Payer: Self-pay | Admitting: Obstetrics

## 2023-05-04 LAB — HEMOGLOBIN A1C
Est. average glucose Bld gHb Est-mCnc: 117 mg/dL
Hgb A1c MFr Bld: 5.7 % — ABNORMAL HIGH (ref 4.8–5.6)

## 2023-05-04 LAB — LIPID PANEL
Chol/HDL Ratio: 4.6 ratio — ABNORMAL HIGH (ref 0.0–4.4)
Cholesterol, Total: 212 mg/dL — ABNORMAL HIGH (ref 100–199)
HDL: 46 mg/dL (ref >39)
LDL Chol Calc (NIH): 135 mg/dL — ABNORMAL HIGH (ref 0–99)
Triglycerides: 176 mg/dL — ABNORMAL HIGH (ref 0–149)
VLDL Cholesterol Cal: 31 mg/dL (ref 5–40)

## 2023-05-04 LAB — TSH: TSH: 2.17 u[IU]/mL (ref 0.450–4.500)

## 2023-05-04 LAB — PROLACTIN: Prolactin: 17.5 ng/mL (ref 4.8–33.4)

## 2023-05-04 LAB — VITAMIN D 25 HYDROXY (VIT D DEFICIENCY, FRACTURES): Vit D, 25-Hydroxy: 22.5 ng/mL — ABNORMAL LOW (ref 30.0–100.0)

## 2023-05-04 LAB — FOLLICLE STIMULATING HORMONE: FSH: 5.7 m[IU]/mL

## 2023-05-04 LAB — HEPATITIS C ANTIBODY: Hep C Virus Ab: NONREACTIVE

## 2023-05-04 LAB — TESTOSTERONE: Testosterone: 56 ng/dL (ref 13–71)

## 2023-05-05 LAB — CYTOLOGY - PAP
Chlamydia: NEGATIVE
Comment: NEGATIVE
Comment: NORMAL
Diagnosis: NEGATIVE
Neisseria Gonorrhea: NEGATIVE

## 2023-06-11 ENCOUNTER — Other Ambulatory Visit: Payer: BC Managed Care – PPO

## 2023-06-24 ENCOUNTER — Other Ambulatory Visit: Payer: 59

## 2023-07-05 ENCOUNTER — Encounter: Payer: Self-pay | Admitting: Obstetrics

## 2023-07-19 ENCOUNTER — Other Ambulatory Visit: Payer: 59

## 2023-07-19 ENCOUNTER — Ambulatory Visit
Admission: RE | Admit: 2023-07-19 | Discharge: 2023-07-19 | Disposition: A | Discharge status: Home / Self Care | Payer: 59 | Source: Ambulatory Visit | Attending: Obstetrics | Admitting: Obstetrics

## 2023-07-19 DIAGNOSIS — N915 Oligomenorrhea, unspecified: Secondary | ICD-10-CM | POA: Insufficient documentation

## 2023-07-20 ENCOUNTER — Encounter: Payer: Self-pay | Admitting: Obstetrics
